# Patient Record
Sex: Female | Born: 1960 | Race: White | Hispanic: No | Marital: Married | Smoking: Never smoker
Health system: Southern US, Community
[De-identification: ages and names within clinical notes are randomized; demographics above are authoritative.]

## PROBLEM LIST (undated history)

## (undated) DIAGNOSIS — R001 Bradycardia, unspecified: Secondary | ICD-10-CM

## (undated) DIAGNOSIS — Z789 Other specified health status: Secondary | ICD-10-CM

## (undated) DIAGNOSIS — N819 Female genital prolapse, unspecified: Secondary | ICD-10-CM

## (undated) DIAGNOSIS — Z973 Presence of spectacles and contact lenses: Secondary | ICD-10-CM

## (undated) HISTORY — PX: DENTAL SURGERY: SHX609

## (undated) HISTORY — PX: TONSILLECTOMY: SUR1361

---

## 2002-10-31 ENCOUNTER — Ambulatory Visit (HOSPITAL_COMMUNITY): Admission: RE | Admit: 2002-10-31 | Discharge: 2002-10-31 | Payer: Self-pay | Admitting: Family Medicine

## 2002-10-31 ENCOUNTER — Encounter: Payer: Self-pay | Admitting: Family Medicine

## 2008-03-25 ENCOUNTER — Emergency Department (HOSPITAL_COMMUNITY): Admission: EM | Admit: 2008-03-25 | Discharge: 2008-03-25 | Payer: Self-pay | Admitting: Emergency Medicine

## 2010-06-30 ENCOUNTER — Encounter: Payer: Self-pay | Admitting: Vascular Surgery

## 2011-06-15 ENCOUNTER — Encounter: Payer: Self-pay | Admitting: Gastroenterology

## 2011-07-14 ENCOUNTER — Encounter: Payer: Self-pay | Admitting: Gastroenterology

## 2013-10-15 ENCOUNTER — Ambulatory Visit (INDEPENDENT_AMBULATORY_CARE_PROVIDER_SITE_OTHER): Payer: 59

## 2013-10-15 VITALS — BP 115/75 | HR 55 | Resp 13 | Ht 62.0 in | Wt 135.0 lb

## 2013-10-15 DIAGNOSIS — M21619 Bunion of unspecified foot: Secondary | ICD-10-CM

## 2013-10-15 DIAGNOSIS — M21611 Bunion of right foot: Secondary | ICD-10-CM

## 2013-10-15 DIAGNOSIS — M778 Other enthesopathies, not elsewhere classified: Secondary | ICD-10-CM

## 2013-10-15 DIAGNOSIS — M2010 Hallux valgus (acquired), unspecified foot: Secondary | ICD-10-CM

## 2013-10-15 DIAGNOSIS — M2011 Hallux valgus (acquired), right foot: Secondary | ICD-10-CM

## 2013-10-15 DIAGNOSIS — M779 Enthesopathy, unspecified: Secondary | ICD-10-CM

## 2013-10-15 DIAGNOSIS — M7750 Other enthesopathy of unspecified foot: Secondary | ICD-10-CM

## 2013-10-15 NOTE — Progress Notes (Signed)
   Subjective:    Patient ID: Holly Bartlett, female    DOB: [birth year], 53 y.o.   MRN: [id number]  HPI Comments: N bunion L right 1st toe and MPJ D 1 - 2 years O worsening C pain and numbness, 1st toe abducts and 1st MPJ is enlarged A weightbearing, and exercise especially biking T Dr. Lenn Sink x-rayed only     Review of Systems  All other systems reviewed and are negative.      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 No history of previous treatment and shoe changes was seen by Dr. Lenn Sink for evaluation the past. Lower extremity objective findings reveal vascular status to be intact pedal pulses palpable DP and PT +2/4 bilateral capillary refill time 3 seconds epicritic and proprioceptive sensations. He intact bilateral normal plantar response DTRs not elicited. 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 No open wounds or ulcers side erythema ulcer pain discomfort comes with enclosed shoes pressure on the area when she bicycles. Feet get her toes get him to the with the bunion area. Patient's is x-rays are reviewed at this time recommendation for likely for an [facility] bunionectomy with pin or screw fixation. Review this and literature dispensed for patient for her to consider.       Assessment & Plan:  Assessment HAV/bunion deformity right foot. Plan  at this time patient indicated for surgery at some point in the future reappoint at her convenience for surgical consult possibly consider surgery December of this year surgery be done an outpatient basis likely to day surgery as patient is a common employee. Patient is given literature about surgery will likely be read activity for type VI week period in air fracture boot although she will return to sitdown job within a week or 2 after surgery otherwise may need to be off her for 5 or 6 weeks before going back to most of her regular activities and duties. Information is dispensed the patient regarding surgery to followup in ready to consider her options. Also did make recommendation for more appropriate shoes as a Birkenstock or crocs something with a support avoid any barefoot flimsy shoes or flip-flops.  Harriet Masson DPM

## 2013-10-15 NOTE — Patient Instructions (Signed)
Bunionectomy A bunionectomy is surgery to remove a bunion. A bunion is an enlargement of the joint at the base of the big toe. It is made up of bone and soft tissue on the inside part of the joint. Over time, a painful lump appears on the inside of the joint. The big toe begins to point inward toward the second toe. New bone growth can occur and a bone spur may form. The pain eventually causes difficulty walking. A bunion usually results from inflammation caused by the irritation of poorly fitting shoes. It often begins later in life. A bunionectomy is performed when nonsurgical treatment no longer works. When surgery is needed, the extent of the procedure will depend on the degree of deformity of the foot. Your surgeon will discuss with you the different procedures and what will work best for you depending on your age and health. LET YOUR CAREGIVER KNOW ABOUT:   Previous problems with anesthetics or medicines used to numb the skin.  Allergies to dyes, iodine, foods, and/or latex.  Medicines taken including herbs, eye drops, prescription medicines (especially medicines used to "thin the blood"), aspirin and other over-the-counter medicines, and steroids (by mouth or as a cream).  History of bleeding or blood problems.  Possibility of pregnancy, if this applies.  History of blood clots in your legs and/or lungs .  Previous surgery.  Other important health problems. RISKS AND COMPLICATIONS   Infection.  Pain.  Nerve damage.  Possibility that the bunion will recur. BEFORE THE PROCEDURE  You should be present 60 minutes prior to your procedure or as directed.  PROCEDURE  Surgery is often done so that you can go home the same day (outpatient). It may be done in a hospital or in an outpatient surgical center. An anesthetic will be used to help you sleep during the procedure. Sometimes, a spinal anesthetic is used to make you numb below the waist. A cut (incision) is made over the swollen  area at the first joint of the big toe. The enlarged lump will be removed. If there is a need to reposition the bones of the big toe, this may require more than 1 incision. The bone itself may need to be cut. Screws and wires may be used in the repair. These can be removed at a later date. In severe cases, the entire joint may need to be removed and a joint replacement inserted. When done, the incision is closed with stitches (sutures). Skin adhesive strips may be added for reinforcement. They help hold the incision closed.  AFTER THE PROCEDURE  Compression bandages (dressings) are then wrapped around the wound. This helps to keep the foot in alignment and reduce swelling. Your foot will be monitored for bleeding and swelling. You will need to stay for a few hours in the recovery area before being discharged. This allows time for the anesthesia to wear off. You will be discharged home when you are awake, stable, and doing well. HOME CARE INSTRUCTIONS   You can expect to return to normal activities within 6 to 8 weeks after surgery. The foot is at increased risk for swelling for several months. When you can expect to bear weight on the operated foot will depend on the extent of your surgery. The milder the deformity, the less tissue is removed and the sooner the return to normal activity level. During the recovery period, a special shoe, boot, or cast may be worn to accommodate the surgical bandage and to help provide stability   to the foot.  Once you are home, an ice pack applied to the operative site may help with discomfort and keep swelling down. Stop using the ice if it causes discomfort.  Keep your feet raised (elevated) when possible to lessen swelling.  If you have an elastic bandage on your foot and you have numbness, tingling, or your foot becomes cold and blue, adjust the bandage to make it comfortable.  Change dressings as directed.  Keep the wound dry and clean. The wound may be washed  gently with soap and water. Gently blot dry without rubbing. Do not take baths or use swimming pools or hot tubs for 10 days, or as instructed by your caregiver.  Only take over-the-counter or prescription medicines for pain, discomfort, or fever as directed by your caregiver.  You may continue a normal diet as directed.  For activity, use crutches with no weight bearing or your orthopedic shoe as directed. Continue to use crutches or a cane as directed until you can stand without causing pain. SEEK MEDICAL CARE IF:   You have redness, swelling, bruising, or increasing pain in the wound.  There is pus coming from the wound.  You have drainage from a wound lasting longer than 1 day.  You have an oral temperature above 102 F (38.9 C).  You notice a bad smell coming from the wound or dressing.  The wound breaks open after sutures have been removed.  You develop dizzy episodes or fainting while standing.  You have persistent nausea or vomiting.  Your toes become cold.  Pain is not relieved with medicines. SEEK IMMEDIATE MEDICAL CARE IF:   You develop a rash.  You have difficulty breathing.  You develop any reaction or side effects to medicines given.  Your toes are numb or blue, or you have severe pain. MAKE SURE YOU:   Understand these instructions.  Will watch your condition.  Will get help right away if you are not doing well or get worse. Document Released: 01/28/2005 Document Revised: 05/09/2011 Document Reviewed: 03/05/2007 ExitCare Patient Information 2015 [employer]. This information is not intended to replace advice given to you by your health care provider. Make sure you discuss any questions you have with your health care provider.  

## 2013-12-01 DIAGNOSIS — N814 Uterovaginal prolapse, unspecified: Secondary | ICD-10-CM | POA: Insufficient documentation

## 2013-12-01 DIAGNOSIS — M542 Cervicalgia: Secondary | ICD-10-CM | POA: Insufficient documentation

## 2013-12-26 ENCOUNTER — Telehealth: Payer: Self-pay | Admitting: *Deleted

## 2013-12-26 NOTE — Telephone Encounter (Signed)
I called and left her another message to call and schedule an appointment with Dr. Blenda Mounts for a consultation.  Then we can schedule you for surgery when you are here.

## 2013-12-26 NOTE — Telephone Encounter (Signed)
"  I want to know why I need to schedule an appointment.  I've already seen him.  I just need to schedule surgery."  I informed her that she needs to see him for a consultation.  She stated, "What did I see him for before?  Wasn't that a consultation?"  I told her no, he took x-rays and informed you what was wrong with your foot.  For the consultation, he will go over the 3 page consent form and give you the surgical packet information.  "Well what is my time frame?"  I told her there is no time frame.  She stated, "What do you mean?  Don't I need to have it scheduled 30 days prior to surgery date or did I misunderstand him?"  I told her she can come in next week for a consultation if she likes.  Dr. Erasmo Downer surgery schedule is pretty open.  "So I can go ahead and schedule for a surgery in January?"  I told her yes.  "Okay, can I schedule an appointment with Dr. Blenda Mounts?"  I told her I would transfer her to a scheduler.

## 2013-12-26 NOTE — Telephone Encounter (Signed)
I've been waiting about 6 days for a call back.  Maybe you don't have the right number.  [phone]  I attempted to call her back.  I left her a message to call me back.

## 2014-01-07 ENCOUNTER — Ambulatory Visit (INDEPENDENT_AMBULATORY_CARE_PROVIDER_SITE_OTHER): Payer: 59

## 2014-01-07 VITALS — BP 109/67 | HR 50 | Resp 13

## 2014-01-07 DIAGNOSIS — M7751 Other enthesopathy of right foot: Secondary | ICD-10-CM

## 2014-01-07 DIAGNOSIS — M2011 Hallux valgus (acquired), right foot: Secondary | ICD-10-CM

## 2014-01-07 DIAGNOSIS — M779 Enthesopathy, unspecified: Secondary | ICD-10-CM

## 2014-01-07 DIAGNOSIS — M21611 Bunion of right foot: Secondary | ICD-10-CM

## 2014-01-07 DIAGNOSIS — M778 Other enthesopathies, not elsewhere classified: Secondary | ICD-10-CM

## 2014-01-07 NOTE — Patient Instructions (Signed)
Pre-Operative Instructions  Congratulations, you have decided to take an important step to improving your quality of life.  You can be assured that the doctors of [facility] will be with you every step of the way.  1. Plan to be at the surgery center/hospital at least 1 (one) hour prior to your scheduled time unless otherwise directed by the surgical center/hospital staff.  You must have a responsible adult accompany you, remain during the surgery and drive you home.  Make sure you have directions to the surgical center/hospital and know how to get there on time. 2. For hospital based surgery you will need to obtain a history and physical form from your family physician within 1 month prior to the date of surgery- we will give you a form for you primary physician.  3. We make every effort to accommodate the date you request for surgery.  There are however, times where surgery dates or times have to be moved.  We will contact you as soon as possible if a change in schedule is required.   4. No Aspirin/Ibuprofen for one week before surgery.  If you are on aspirin, any non-steroidal anti-inflammatory medications (Mobic, Aleve, Ibuprofen) you should stop taking it 7 days prior to your surgery.  You make take Tylenol  For pain prior to surgery.  5. Medications- If you are taking daily heart and blood pressure medications, seizure, reflux, allergy, asthma, anxiety, pain or diabetes medications, make sure the surgery center/hospital is aware before the day of surgery so they may notify you which medications to take or avoid the day of surgery. 6. No food or drink after midnight the night before surgery unless directed otherwise by surgical center/hospital staff. 7. No alcoholic beverages 24 hours prior to surgery.  No smoking 24 hours prior to or 24 hours after surgery. 8. Wear loose pants or shorts- loose enough to fit over bandages, boots, and casts. 9. No slip on shoes, sneakers are best. 10. Bring  your boot with you to the surgery center/hospital.  Also bring crutches or a walker if your physician has prescribed it for you.  If you do not have this equipment, it will be provided for you after surgery. 11. If you have not been contracted by the surgery center/hospital by the day before your surgery, call to confirm the date and time of your surgery. 12. Leave-time from work may vary depending on the type of surgery you have.  Appropriate arrangements should be made prior to surgery with your employer. 13. Prescriptions will be provided immediately following surgery by your doctor.  Have these filled as soon as possible after surgery and take the medication as directed. 14. Remove nail polish on the operative foot. 15. Wash the night before surgery.  The night before surgery wash the foot and leg well with the antibacterial soap provided and water paying special attention to beneath the toenails and in between the toes.  Rinse thoroughly with water and dry well with a towel.  Perform this wash unless told not to do so by your physician.  Enclosed: 1 Ice pack (please put in freezer the night before surgery)   1 Hibiclens skin cleaner   Pre-op Instructions  If you have any questions regarding the instructions, do not hesitate to call our office.  [place]: [address] [place], [place] [address] [phone]  [place]: [address] [place], [place] [address] [phone]  [place]: [address]  [place], [place] [address] [phone]  Dr. Honey Zakarian   Tuchman DPM, Dr. Norman Regal DPM Dr. Mercadez Heitman DPM, Dr. M. Todd Hyatt DPM, Dr. Kathryn Egerton DPM 

## 2014-01-07 NOTE — Progress Notes (Signed)
   Subjective:    Patient ID: Holly Bartlett, female    DOB: [birth year], 53 y.o.   MRN: [id number]  HPI patient presents at this time discussed surgery for painful bunion right foot. She continues to have pain tenderness and capsulitis first MTP area of prominence of the great toe joint right foot. Activities range of motion and on palpation.   Review of Systemsno new findings or systemic changes are noted    Objective:   Physical Exam  neurovascular status is intact pedal pulses are palpable epicritic and proprioceptive sensations intact and symmetric there are no contraindications to surgery at this time.again vascular status is intact with pulses palpable no open wounds or ulcers are noted x-rays head been reviewed revealing IM angle of 14 hallux abductus angle 22 sesamoid position 5 clinically there is good range of motion dorsal flexion plantar flexion hallux is slightly tracked bilaterally is dorsal medial eminence and prominence which needs to be resected. At this time did review the procedure risks, applications alternatives potential side effects patient will be under IV sedation and local anesthetic or regional block.        Assessment & Plan:  Assessment this time is hallux abductovalgus deformity capsulitis first MTP area right consent form for [facility] bunionectomy with K wire or screw fixations reviewed and signed all questions asked by the patient or answered her medications and surgery scheduled at her convenience in January 2016. She is also given paperwork for surgery done at [facility] day surgery will require H&P from her primary physician within 2 weeks of surgery time. Will likely need an H&P in December just prior to the end of the year. At this time there are no other significant contraindications and surgery scheduled her convenience with appropriate follow-up patient understands she will be in air fracture boot for proximally 1 month a boot is dispensed for her to wear and  practice in prior to surgery. She will be weightbearing in the boot however will be strict restricted from work activities for likely 6 weeks afterwards should be able to return to moderated her activities over time. Surgery scheduled at this time   Harriet Masson DPM

## 2014-02-05 ENCOUNTER — Telehealth: Payer: Self-pay | Admitting: *Deleted

## 2014-02-05 NOTE — Telephone Encounter (Signed)
Returned call-patient had questions regarding time out from work. She is having trouble getting her FMLA coverage. She is currently scheduled for January, 6 2016 and will call us to reschedule if she needs to.

## 2014-02-05 NOTE — Telephone Encounter (Signed)
Patient called with questions regarding upcoming surgery.

## 2014-02-10 ENCOUNTER — Ambulatory Visit: Payer: Self-pay | Admitting: Physician Assistant

## 2014-02-25 ENCOUNTER — Telehealth: Payer: Self-pay | Admitting: *Deleted

## 2014-02-25 NOTE — Telephone Encounter (Signed)
Pt called on cellphone while in a car, I was unable to understand the message, but was able to get the phone number.  I spoke with pt, she states she did not get the Chlorhexidine sponge for her pre-op.  I told pt I would have a surgery bag waiting at the front desk of the [facility] office for her to pick up.

## 2014-02-27 ENCOUNTER — Encounter (HOSPITAL_BASED_OUTPATIENT_CLINIC_OR_DEPARTMENT_OTHER): Payer: Self-pay | Admitting: *Deleted

## 2014-02-27 NOTE — Progress Notes (Signed)
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

## 2014-03-04 ENCOUNTER — Other Ambulatory Visit: Payer: Self-pay

## 2014-03-04 NOTE — H&P (Addendum)
Holly Bartlett is an 54 y.o. female.   Chief Complaint: Patient is a painful bunion deformity right foot x-rays confirm elevated I am angle some deviation sesamoid position. HPI: Bunion deformities and present for several years with increasing pain tenderness discomfort over the last several months pain with enclosed shoe wear and ambulation patient is tried changing shoes pads cushions with minimal or temporary success. No open wounds no ulcerations no secondary infection is noted  Past Medical History  Diagnosis Date  . Medical history non-contributory   . Wears glasses     Past Surgical History  Procedure Laterality Date  . Dental surgery      wisdom teeth    Family History  Problem Relation Age of Onset  . Diabetes Mother   . Hypertension Mother   . Cancer Mother   . Diabetes Father   . Heart disease Father    Social History:  reports that she has never smoked. She does not have any smokeless tobacco history on file. She reports that she drinks alcohol. She reports that she does not use illicit drugs.  Allergies: No Known Allergies  No prescriptions prior to admission    No results found for this or any previous visit (from the past 48 hour(s)). No results found.  Review of Systems  Constitutional: Negative.   HENT: Negative.   Eyes: Negative.   Respiratory: Negative.   Cardiovascular: Negative.   Gastrointestinal: Negative.   Musculoskeletal: Positive for joint pain.  Skin: Negative.   Neurological: Negative.   Endo/Heme/Allergies: Negative.   Psychiatric/Behavioral: Negative.     Height 5\' 2"  (1.575 m), weight 135 lb (61.236 kg). Physical Exam  Vitals reviewed. Constitutional: She is oriented to person, place, and time. She appears well-developed and well-nourished.  HENT:  Head: Normocephalic and atraumatic.  Eyes: EOM are normal. Pupils are equal, round, and reactive to light.  Neck: Normal range of motion. Neck supple.  Cardiovascular: Normal rate,  regular rhythm and intact distal pulses.   Pulses:      Dorsalis pedis pulses are 2+ on the right side, and 2+ on the left side.       Posterior tibial pulses are 2+ on the right side, and 2+ on the left side.  Lower extremity objective findings reveal pedal pulses palpable DP and PT bilateral capillary refill time 3 seconds all digits. No edema minimal or no varicosities noted skin temperature warm  Respiratory: Effort normal and breath sounds normal.  GI: She exhibits no distension. There is no tenderness.  Musculoskeletal: Normal range of motion.  Lower extremity orthopedic and biomechanical exam reveals rectus foot type bilateral notable HAV deformity right with elevated I am angle greater than 12 sesamoid position 4-5 deviation of sesamoids with increased hallux abductus angle greater than 25 IM angle greater than 12-13. X-rays are reviewed otherwise unremarkable no cysts tumors or fractures noted intact motor function is muscle strength and range of motion is of the remainder foot exam bilateral noted to be unremarkable.  Neurological: She is alert and oriented to person, place, and time. She has normal strength and normal reflexes. She displays no Babinski's sign on the right side. She displays no Babinski's sign on the left side.  Neurovascular status is intact epicritic and proprioceptive sensations intact and symmetric bilateral muscle strengths normal and intact DTRs intact and symmetric bilateral  Skin: Skin is warm and dry. No cyanosis. Nails show no clubbing.  Lower extremity objective findings skin texture turgor normal hair growth present but diminished  absent distally nail somewhat criptotic and otherwise unremarkable no open wounds no ulcers no secondary infections noted  Psychiatric: She has a normal mood and affect. Her behavior is normal. Judgment and thought content normal.     Assessment/Plan Hallux abductovalgus deformity right foot. Capsulitis first MTP area right  consistent with deformity pain discomfort with activities ambulation and include shoe wear noted.  Plan at this time is for [facility] with screw K wire fixation. There were medications to surgery at this time neurovascular status is intact pedal pulses are palpable agent is aware she'll be in a postoperative surgical shoe or boot for 5-6 week duration weightbearing with the air fracture walker. Surgery proceeded scheduled at this time.No contraindications noted,  surgery to proceed  Harriet Masson DPM  Sigfredo Schreier 03/04/2014, 5:57 PM

## 2014-03-05 ENCOUNTER — Encounter (HOSPITAL_BASED_OUTPATIENT_CLINIC_OR_DEPARTMENT_OTHER): Admission: RE | Disposition: A | Discharge status: Home / Self Care | Payer: Self-pay | Source: Ambulatory Visit

## 2014-03-05 ENCOUNTER — Ambulatory Visit (HOSPITAL_BASED_OUTPATIENT_CLINIC_OR_DEPARTMENT_OTHER)
Admission: RE | Admit: 2014-03-05 | Discharge: 2014-03-05 | Disposition: A | Discharge status: Home / Self Care | Payer: 59 | Source: Ambulatory Visit

## 2014-03-05 ENCOUNTER — Encounter (HOSPITAL_BASED_OUTPATIENT_CLINIC_OR_DEPARTMENT_OTHER): Payer: Self-pay | Admitting: Certified Registered"

## 2014-03-05 ENCOUNTER — Ambulatory Visit (HOSPITAL_BASED_OUTPATIENT_CLINIC_OR_DEPARTMENT_OTHER): Payer: 59 | Admitting: Certified Registered"

## 2014-03-05 DIAGNOSIS — M2011 Hallux valgus (acquired), right foot: Secondary | ICD-10-CM | POA: Insufficient documentation

## 2014-03-05 DIAGNOSIS — M779 Enthesopathy, unspecified: Secondary | ICD-10-CM

## 2014-03-05 HISTORY — PX: BUNIONECTOMY: SHX129

## 2014-03-05 HISTORY — DX: Other specified health status: Z78.9

## 2014-03-05 HISTORY — DX: Presence of spectacles and contact lenses: Z97.3

## 2014-03-05 LAB — POCT HEMOGLOBIN-HEMACUE: Hemoglobin: 13.2 g/dL (ref 12.0–15.0)

## 2014-03-05 SURGERY — BUNIONECTOMY
Anesthesia: Monitor Anesthesia Care | Site: Foot | Laterality: Right

## 2014-03-05 MED ORDER — ONDANSETRON HCL 4 MG/2ML IJ SOLN
INTRAMUSCULAR | Status: DC | PRN
  Administered 2014-03-05: 4 mg via INTRAVENOUS

## 2014-03-05 MED ORDER — CEPHALEXIN 500 MG PO CAPS: 500.0000 mg | ORAL_CAPSULE | Freq: Four times a day (QID) | ORAL | Status: DC

## 2014-03-05 MED ORDER — CEFAZOLIN SODIUM 1-5 GM-% IV SOLN
INTRAVENOUS | Status: AC
  Filled 2014-03-05: qty 100

## 2014-03-05 MED ORDER — LIDOCAINE HCL (CARDIAC) 20 MG/ML IV SOLN
INTRAVENOUS | Status: DC | PRN
  Administered 2014-03-05: 20 mg via INTRAVENOUS

## 2014-03-05 MED ORDER — PROPOFOL INFUSION 10 MG/ML OPTIME
INTRAVENOUS | Status: DC | PRN
  Administered 2014-03-05: 100 ug/kg/min via INTRAVENOUS

## 2014-03-05 MED ORDER — DEXAMETHASONE SODIUM PHOSPHATE 10 MG/ML IJ SOLN
INTRAMUSCULAR | Status: DC | PRN
  Administered 2014-03-05: 4 mg via INTRAVENOUS

## 2014-03-05 MED ORDER — CHLORHEXIDINE GLUCONATE 4 % EX LIQD: 60.0000 mL | Freq: Once | CUTANEOUS | Status: DC

## 2014-03-05 MED ORDER — ACETAMINOPHEN 325 MG PO TABS
ORAL_TABLET | ORAL | Status: AC
  Filled 2014-03-05: qty 3

## 2014-03-05 MED ORDER — FENTANYL CITRATE 0.05 MG/ML IJ SOLN: 25.0000 ug | INTRAMUSCULAR | Status: DC | PRN

## 2014-03-05 MED ORDER — ACETAMINOPHEN 325 MG PO TABS
ORAL_TABLET | ORAL | Status: DC | PRN
  Administered 2014-03-05: 975 mg via ORAL

## 2014-03-05 MED ORDER — BUPIVACAINE HCL (PF) 0.5 % IJ SOLN
INTRAMUSCULAR | Status: AC
  Filled 2014-03-05: qty 30

## 2014-03-05 MED ORDER — MIDAZOLAM HCL 2 MG/2ML IJ SOLN: 1.0000 mg | INTRAMUSCULAR | Status: DC | PRN

## 2014-03-05 MED ORDER — BUPIVACAINE HCL (PF) 0.5 % IJ SOLN
INTRAMUSCULAR | Status: DC | PRN
  Administered 2014-03-05: 7.5 mL via INTRA_ARTICULAR

## 2014-03-05 MED ORDER — KETOROLAC TROMETHAMINE 30 MG/ML IJ SOLN
INTRAMUSCULAR | Status: DC | PRN
  Administered 2014-03-05: 30 mg via INTRAVENOUS

## 2014-03-05 MED ORDER — PROPOFOL 10 MG/ML IV EMUL
INTRAVENOUS | Status: AC
  Filled 2014-03-05: qty 50

## 2014-03-05 MED ORDER — OXYCODONE-ACETAMINOPHEN 5-325 MG PO TABS: 1.0000 | ORAL_TABLET | ORAL | Status: DC | PRN

## 2014-03-05 MED ORDER — CEFAZOLIN SODIUM-DEXTROSE 2-3 GM-% IV SOLR
2.0000 g | INTRAVENOUS | Status: AC
  Administered 2014-03-05: 2 g via INTRAVENOUS

## 2014-03-05 MED ORDER — PROPOFOL INFUSION 10 MG/ML OPTIME: INTRAVENOUS | Status: DC | PRN

## 2014-03-05 MED ORDER — FENTANYL CITRATE 0.05 MG/ML IJ SOLN: 50.0000 ug | INTRAMUSCULAR | Status: DC | PRN

## 2014-03-05 MED ORDER — FENTANYL CITRATE 0.05 MG/ML IJ SOLN
INTRAMUSCULAR | Status: AC
  Filled 2014-03-05: qty 6

## 2014-03-05 MED ORDER — MIDAZOLAM HCL 2 MG/2ML IJ SOLN
INTRAMUSCULAR | Status: AC
  Filled 2014-03-05: qty 2

## 2014-03-05 MED ORDER — ONDANSETRON HCL 4 MG/2ML IJ SOLN: 4.0000 mg | Freq: Four times a day (QID) | INTRAMUSCULAR | Status: DC | PRN

## 2014-03-05 MED ORDER — LIDOCAINE HCL 2 % IJ SOLN
INTRAMUSCULAR | Status: AC
Start: 2014-03-05 — End: 2014-03-05
  Filled 2014-03-05: qty 20

## 2014-03-05 MED ORDER — LACTATED RINGERS IV SOLN
INTRAVENOUS | Status: DC
  Administered 2014-03-05 (×2): via INTRAVENOUS

## 2014-03-05 MED ORDER — LIDOCAINE HCL 2 % IJ SOLN
INTRAMUSCULAR | Status: DC | PRN
  Administered 2014-03-05: 7.5 mL

## 2014-03-05 MED ORDER — FENTANYL CITRATE 0.05 MG/ML IJ SOLN
INTRAMUSCULAR | Status: DC | PRN
  Administered 2014-03-05: 50 ug via INTRAVENOUS
  Administered 2014-03-05: 25 ug via INTRAVENOUS

## 2014-03-05 MED ORDER — MIDAZOLAM HCL 5 MG/5ML IJ SOLN
INTRAMUSCULAR | Status: DC | PRN
  Administered 2014-03-05 (×2): 1 mg via INTRAVENOUS

## 2014-03-05 MED ORDER — DEXAMETHASONE SODIUM PHOSPHATE 10 MG/ML IJ SOLN
INTRAMUSCULAR | Status: DC | PRN
  Administered 2014-03-05: 10 mg via INTRA_ARTICULAR

## 2014-03-05 SURGICAL SUPPLY — 55 items
BAG DECANTER FOR FLEXI CONT (MISCELLANEOUS) ×3 IMPLANT
BANDAGE ELASTIC 3 VELCRO ST LF (GAUZE/BANDAGES/DRESSINGS) ×3 IMPLANT
BENZOIN TINCTURE PRP APPL 2/3 (GAUZE/BANDAGES/DRESSINGS) IMPLANT
BLADE AVERAGE 25MMX9MM (BLADE) ×1
BLADE AVERAGE 25X9 (BLADE) ×2 IMPLANT
BLADE SURG 15 STRL LF DISP TIS (BLADE) ×2 IMPLANT
BLADE SURG 15 STRL SS (BLADE) ×4
BNDG CONFORM 2 STRL LF (GAUZE/BANDAGES/DRESSINGS) ×3 IMPLANT
BNDG ESMARK 4X9 LF (GAUZE/BANDAGES/DRESSINGS) ×3 IMPLANT
BUR SIDE CUT 44.8 STRL (BURR) IMPLANT
BURR SIDE CUT 44.8 STRL (BURR)
BURR SIDE CUT 44.8MML STRL (BURR)
CAP PIN PROTECTOR ORTHO WHT (CAP) IMPLANT
COVER BACK TABLE 60X90IN (DRAPES) ×3 IMPLANT
CUFF TOURNIQUET SINGLE 18IN (TOURNIQUET CUFF) ×3 IMPLANT
DRAPE EXTREMITY T 121X128X90 (DRAPE) ×3 IMPLANT
DRAPE OEC MINIVIEW 54X84 (DRAPES) ×3 IMPLANT
DRILL BIT WIRE PASS (BIT) IMPLANT
DRILL WIRE PASS MED (BIT) IMPLANT
DRSG EMULSION OIL 3X3 NADH (GAUZE/BANDAGES/DRESSINGS) ×3 IMPLANT
DURAPREP 26ML APPLICATOR (WOUND CARE) ×3 IMPLANT
ELECT REM PT RETURN 9FT ADLT (ELECTROSURGICAL) ×3
ELECTRODE REM PT RTRN 9FT ADLT (ELECTROSURGICAL) ×1 IMPLANT
GAUZE SPONGE 4X4 12PLY STRL (GAUZE/BANDAGES/DRESSINGS) ×3 IMPLANT
GAUZE SPONGE 4X4 16PLY XRAY LF (GAUZE/BANDAGES/DRESSINGS) IMPLANT
GLOVE BIOGEL PI IND STRL 7.0 (GLOVE) ×1 IMPLANT
GLOVE BIOGEL PI INDICATOR 7.0 (GLOVE) ×2
GLOVE ECLIPSE 6.5 STRL STRAW (GLOVE) ×3 IMPLANT
GLOVE SS BIOGEL STRL SZ 8 (GLOVE) ×1 IMPLANT
GLOVE SUPERSENSE BIOGEL SZ 8 (GLOVE) ×2
GOWN STRL REUS W/ TWL LRG LVL3 (GOWN DISPOSABLE) ×1 IMPLANT
GOWN STRL REUS W/TWL 2XL LVL3 (GOWN DISPOSABLE) ×3 IMPLANT
GOWN STRL REUS W/TWL LRG LVL3 (GOWN DISPOSABLE) ×2
K-WIRE .045X4 (WIRE) ×3 IMPLANT
NDL SAFETY ECLIPSE 18X1.5 (NEEDLE) ×2 IMPLANT
NEEDLE HYPO 18GX1.5 SHARP (NEEDLE) ×4
NEEDLE HYPO 25X1 1.5 SAFETY (NEEDLE) ×6 IMPLANT
NS IRRIG 1000ML POUR BTL (IV SOLUTION) IMPLANT
PACK BASIN DAY SURGERY FS (CUSTOM PROCEDURE TRAY) ×3 IMPLANT
PADDING CAST ABS 4INX4YD NS (CAST SUPPLIES) ×2
PADDING CAST ABS COTTON 4X4 ST (CAST SUPPLIES) ×1 IMPLANT
PENCIL BUTTON HOLSTER BLD 10FT (ELECTRODE) ×3 IMPLANT
SHEET MEDIUM DRAPE 40X70 STRL (DRAPES) ×3 IMPLANT
STOCKINETTE 6  STRL (DRAPES) ×2
STOCKINETTE 6 STRL (DRAPES) ×1 IMPLANT
STRIP SUTURE WOUND CLOSURE 1/2 (SUTURE) IMPLANT
SUT VIC AB 3-0 PS1 18 (SUTURE)
SUT VIC AB 3-0 PS1 18XBRD (SUTURE) IMPLANT
SUT VIC AB 5-0 PS2 18 (SUTURE) ×3 IMPLANT
SUT VICRYL 4-0 PS2 18IN ABS (SUTURE) ×3 IMPLANT
SYR 3ML 18GX1 1/2 (SYRINGE) ×3 IMPLANT
SYR 5ML LL (SYRINGE) ×3 IMPLANT
SYR BULB 3OZ (MISCELLANEOUS) ×3 IMPLANT
SYRINGE 10CC LL (SYRINGE) ×3 IMPLANT
UNDERPAD 30X30 INCONTINENT (UNDERPADS AND DIAPERS) ×3 IMPLANT

## 2014-03-05 NOTE — Discharge Instructions (Addendum)
After Surgery Instructions   1) If you are recuperating from surgery anywhere other than home, please be sure to leave [place] the number where you can be reached.  2) Go directly home and rest.  3) Keep the operated foot(feet) elevated six inches above the hip when sitting or lying down. This will help control swelling and pain.  4) Support the elevated foot and leg with pillows. DO NOT PLACE PILLOWS UNDER THE KNEE.  5) DO NOT REMOVE or get your bandages WET, unless you were given different instructions by your doctor to do so. This increases the risk of infection.  6) Wear your surgical shoe or surgical boot at all times when you are up on your feet.  7) A limited amount of pain and swelling may occur. The skin may take on a bruised appearance. DO NOT BE ALARMED, THIS IS NORMAL.  8) For slight pain and swelling, apply an ice pack directly over the bandages for 15 minutes only out of each hour of the day. Continue until seen in the office for your first post op visit. DON NOT     APPLY ANY FORM OF HEAT TO THE AREA.  9) Have prescriptions filled immediately and take as directed.  10) Drink lots of liquids, water and juice to stay hydrated.  11) CALL IMMEDIATELY IF:  *Bleeding continues until the following day of surgery  *Pain increases and/or does not respond to medication  *Bandages or cast appears to tight  *If your bandage gets wet  *Trip, fall or stump your surgical foot  *If your temperature goes above 101  *If you have ANY questions at all  [place]. ADHERING TO THESE INSTRUCTIONS WILL OFFER YOU THE MOST COMPLETE RESULTS    Post Anesthesia Home Care Instructions  Activity: Get plenty of rest for the remainder of the day. A responsible adult should stay with you for 24 hours following the procedure.  For the next 24 hours, DO NOT: -Drive a car -Paediatric nurse -Drink alcoholic beverages -Take any medication unless instructed by your  physician -Make any legal decisions or sign important papers.  Meals: Start with liquid foods such as gelatin or soup. Progress to regular foods as tolerated. Avoid greasy, spicy, heavy foods. If nausea and/or vomiting occur, drink only clear liquids until the nausea and/or vomiting subsides. Call your physician if vomiting continues.  Special Instructions/Symptoms: Your throat may feel dry or sore from the anesthesia or the breathing tube placed in your throat during surgery. If this causes discomfort, gargle with warm salt water. The discomfort should disappear within 24 hours.

## 2014-03-05 NOTE — Op Note (Signed)
Procedure(s): AUSTIN BUNIONECTOMY RIGHT FOOT Procedure Note  Holly Bartlett female 54 y.o. 03/05/2014  Procedure(s) and Anesthesia Type:    * AUSTIN BUNIONECTOMY RIGHT FOOT - Monitor Anesthesia Care  Surgeon(s) and Role:    * Harriet Masson, DPM - Primary   Indications: patient has a several month history of increasing pain tenderness discomfort affecting her right great toe joint bunion deformity based on clinical and radiographic findings patient is requesting surgery to be completed at this time for correction of bunion with internal fixation.        Surgeon: Harriet Masson   Assistants: none  Anesthesia: Monitored Local Anesthesia with Sedation  ASA Class: 1    Procedure Detail  AUSTIN BUNIONECTOMY RIGHT FOOT  Findings:  Patient was brought to your placed on the table in the supine position. IV sedation was established, local anesthetic block was then administered with a total of 15 mL 50-50 mixture of 2% Xylocaine plain and 0.5% Marcaine plain in a Mayo block fashion. The right foot was exsanguinated via Esmarch wrap and ankle tourniquet inflated to 250 mmHg. Following procedure was then carried out.  Attention was directed to the dorsal aspect of the right foot over the first MTP joint proximally 7 cm curvilinear incision was made just medial to and paralleling the long extensor tendon. The incision was deepened via sharp dissection to the capsular structures. An inverted L capsular incision was made medial collateral ligaments were freed and the head of the first metatarsal was delivered into the operative field. Utilizing partial rotation hypertrophied dorsal and medial eminence were resected flush with the shaft. Attention redirected to the interspace where the conjoined tendon of the adductor hallucis was identified and a 1 cm section removed. The fibular sesamoid was then freed distally proximally and laterally. Upon returning to the first metatarsal head a through and  through V osteotomy was carried out in standard [place] fashion. The capital fragment was shifted laterally and impacted and fixated with a single K wire 0.045 from dorsal proximal to plantar distal and orientation. The wire was cut and bent and left lying flush to the metatarsal shaft redundant medial shelf was resected and all rough edges were smoothed. The site was flushed with copious muscle Starbuck solution and cleared of all soft tissue and osseous debris. Fluoroscopy confirmed fixation placement and osteotomy placement and good clinical and radiographic alignments.  Medial capsulorrhaphy was performed and capsule reapproximated with 3-0 Vicryl. Subcutaneous tissue reapproximated with 4-0 Vicryl, and skin closed with 5-0 Vicryl in a subcuticular fashion. The site was infiltrated with 1 mL dexamethasone phosphate 4 mg/mL. Betadine Adaptic and a dry sterile compressive dressing were applied to the right foot Steri-Strips having been placed. Once dressed the ankle tourniquet was deflated with immediate return of perfusion to all digits being noted.  Patient was returned from the OR to recovery in satisfactory condition. Discharged with oral and written postop instructions. An appointment for follow-up office visit at the [facility].    Estimated Blood Loss:  Minimal         Drains:none             Specimens: none         I mplants:0.045 K wire was used for fixation        Complications:  * No complications entered in OR log *         Disposition: PACU - hemodynamically stable.         Condition: stable  Harriet Masson DPM

## 2014-03-05 NOTE — Anesthesia Preprocedure Evaluation (Signed)
Anesthesia Evaluation  Patient identified by MRN, date of birth, ID band Patient awake    Reviewed: Allergy & Precautions, NPO status , Patient's Chart, lab work & pertinent test results  Airway Mallampati: II   Neck ROM: full    Dental   Pulmonary neg pulmonary ROS,          Cardiovascular negative cardio ROS      Neuro/Psych    GI/Hepatic   Endo/Other    Renal/GU      Musculoskeletal   Abdominal   Peds  Hematology   Anesthesia Other Findings   Reproductive/Obstetrics                             Anesthesia Physical Anesthesia Plan  ASA: I  Anesthesia Plan: MAC   Post-op Pain Management:    Induction: Intravenous  Airway Management Planned: Simple Face Mask  Additional Equipment:   Intra-op Plan:   Post-operative Plan:   Informed Consent: I have reviewed the patients History and Physical, chart, labs and discussed the procedure including the risks, benefits and alternatives for the proposed anesthesia with the patient or authorized representative who has indicated his/her understanding and acceptance.     Plan Discussed with: CRNA, Anesthesiologist and Surgeon  Anesthesia Plan Comments:         Anesthesia Quick Evaluation

## 2014-03-05 NOTE — Anesthesia Postprocedure Evaluation (Signed)
Anesthesia Post Note  Patient: Holly Bartlett  Procedure(s) Performed: Procedure(s) (LRB): AUSTIN BUNIONECTOMY RIGHT FOOT (Right)  Anesthesia type: MAC  Patient location: PACU  Post pain: Pain level controlled and Adequate analgesia  Post assessment: Post-op Vital signs reviewed, Patient's Cardiovascular Status Stable and Respiratory Function Stable  Last Vitals:  Filed Vitals:   03/05/14 1000  BP: 116/88  Pulse: 51  Temp:   Resp: 17    Post vital signs: Reviewed and stable  Level of consciousness: awake, alert  and oriented  Complications: No apparent anesthesia complications

## 2014-03-05 NOTE — Brief Op Note (Signed)
Subjective: Day of Surgery Procedure(s) (LRB): AUSTIN BUNIONECTOMY RIGHT FOOT (Right) Patient reports pain as 0 on 0-10 scale.    Objective: Vital signs in last 24 hours: Temp:  [97.7 F (36.5 C)] 97.7 F (36.5 C) (01/06 0714) Pulse Rate:  [57-61] 57 (01/06 0939) Resp:  [12-16] 12 (01/06 0939) BP: (115)/(75) 115/75 mmHg (01/06 0714) SpO2:  [99 %] 99 % (01/06 0939) Weight:  [135 lb (61.236 kg)] 135 lb (61.236 kg) (01/06 0714)  Intake/Output from previous day:   Intake/Output this shift: Total I/O In: 1100 [I.V.:1100] Out: -    Recent Labs  03/05/14 0807  HGB 13.2   No results for input(s): WBC, RBC, HCT, PLT in the last 72 hours. No results for input(s): NA, K, CL, CO2, BUN, CREATININE, GLUCOSE, CALCIUM in the last 72 hours. No results for input(s): LABPT, INR in the last 72 hours.  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 Anesthesia IV sedation and local anesthetic block total of 15 mL 50-50 mixture of 2% Xylocaine plain and 0.5% Marcaine plain were infiltrated in a Mayo block fashion.  Hemostasis: Ankle tourniquet at 250 mmHg 31 minutes.  Estimated blood loss minimal less than 1 mL  Meds and injectables: Upon completion of surgery the site was infiltrated with 1 mL dexamethasone phosphate 10 oh grams per cc  Condition patient tolerated surgery well vital signs stable with return to the OR from the OR to recovery in satisfactory condition. To be discharged from recovery per anesthesia with postoperative home  instructions prescriptions for pain antibiotic medications.  Novalyn Lajara 03/05/2014, 9:42 AM

## 2014-03-05 NOTE — Transfer of Care (Signed)
Immediate Anesthesia Transfer of Care Note  Patient: Holly Bartlett  Procedure(s) Performed: Procedure(s): AUSTIN BUNIONECTOMY RIGHT FOOT (Right)  Patient Location: PACU  Anesthesia Type:MAC  Level of Consciousness: awake, alert , oriented and patient cooperative  Airway & Oxygen Therapy: Patient Spontanous Breathing and Patient connected to face mask oxygen  Post-op Assessment: Report given to PACU RN, Post -op Vital signs reviewed and stable and Patient moving all extremities  Post vital signs: Reviewed and stable  Complications: No apparent anesthesia complications

## 2014-03-05 NOTE — Anesthesia Procedure Notes (Signed)
Procedure Name: MAC Date/Time: 03/05/2014 8:46 AM Performed by: Baxter Flattery Pre-anesthesia Checklist: Patient identified, Emergency Drugs available, Suction available and Patient being monitored Patient Re-evaluated:Patient Re-evaluated prior to inductionOxygen Delivery Method: Simple face mask Preoxygenation: Pre-oxygenation with 100% oxygen Ventilation: Mask ventilation without difficulty Dental Injury: Teeth and Oropharynx as per pre-operative assessment

## 2014-03-06 ENCOUNTER — Encounter (HOSPITAL_BASED_OUTPATIENT_CLINIC_OR_DEPARTMENT_OTHER): Payer: Self-pay

## 2014-03-11 ENCOUNTER — Ambulatory Visit (INDEPENDENT_AMBULATORY_CARE_PROVIDER_SITE_OTHER): Payer: 59

## 2014-03-11 VITALS — BP 105/68 | HR 55 | Resp 18

## 2014-03-11 DIAGNOSIS — M2011 Hallux valgus (acquired), right foot: Secondary | ICD-10-CM

## 2014-03-11 DIAGNOSIS — Z09 Encounter for follow-up examination after completed treatment for conditions other than malignant neoplasm: Secondary | ICD-10-CM

## 2014-03-11 NOTE — Patient Instructions (Addendum)
ICE INSTRUCTIONS  Apply ice or cold pack to the affected area at least 3 times a day for 10-15 minutes each time.  You should also use ice after prolonged activity or vigorous exercise.  Do not apply ice longer than 20 minutes at one time.  Always keep a cloth between your skin and the ice pack to prevent burns.  Being consistent and following these instructions will help control your symptoms.  We suggest you purchase a gel ice pack because they are reusable and do bit leak.  Some of them are designed to wrap around the area.  Use the method that works best for you.  Here are some other suggestions for icing.   Use a frozen bag of peas or corn-inexpensive and molds well to your body, usually stays frozen for 10 to 20 minutes.  Wet a towel with cold water and squeeze out the excess until it's damp.  Place in a bag in the freezer for 20 minutes. Then remove and use.   Maintain boot for 4 more weeks as instructed. Must wear boot for any walking standing or during sleep.  Starting next week may removed bandages and dressings and resume normal bathing and washing the foot. After washing the apply Neosporin are cocoa butter to the incision areas to help with scar. After removing bandages next week we'll not need to rewrap the foot however will maintain the [employer] or compression stocking during the day. Stocking may be removed every evening washed in air dry over night and reapplied in the mornings.  Activity increases as follows allowed 5 minutes per hour of walking the first week second week 10 minutes per hour, third week 15 minutes per hour and so on. Next  Reappointed 4 weeks for follow-up x-ray. Maintain boot at all times for any walking or standing

## 2014-03-11 NOTE — Progress Notes (Signed)
   Subjective:    Patient ID: Holly Bartlett, female    DOB: 1960/05/16, 54 y.o.   MRN: [id number]  HPI i had surgery on my right foot and it is doing good and i have not taken any pain medicine since friday    Review of Systems no new findings or systemic changes noted     Objective:   Physical Exam Neurovascular status is intact pedal pulses palpable mild edema and ecchymosis consistent with postop course. X-rays reveal good position the osteotomy and hallux and K wire fixation clinically good range of motion approximately 50 dorsal flexion 510 plantar flexion no crepitus is noted dry sterile dressing reapplied at this time maintain dressing for one week. After week may remove dressings and resume bathing and hygiene steadily increasing walking activities as instructed maintain air fracture boot for walking at all times. Advised that active and passive range of motion exercises to great toe every day 100 toe pushup today recommended.       Assessment & Plan:  Assessment good postop progress presto dressing is reapplied at this time maintained for one more week reappointed 4 weeks for long-term postop follow-up and x-rays after that time may likely return to walking tennis or athletic shoes. At 6 weeks should be returned to work activities. The increasing walking activities each week by 5 minutes. Reappointed 4 weeks  Harriet Masson DPM

## 2014-03-18 ENCOUNTER — Telehealth: Payer: Self-pay | Admitting: *Deleted

## 2014-03-18 NOTE — Progress Notes (Signed)
DOS 03/05/2014 right austin bunionectomy

## 2014-03-18 NOTE — Telephone Encounter (Addendum)
Pt states there is one white stitch left, can she remove it and can she go swimming in chlorinated pool.  Dr. Blenda Mounts states it is okay to swim in the chlorinated pool, and to leave the white stitch alone it will fall off by itself.  Left message.

## 2014-03-18 NOTE — Telephone Encounter (Signed)
Please advice  

## 2014-03-19 ENCOUNTER — Telehealth: Payer: Self-pay | Admitting: *Deleted

## 2014-03-19 NOTE — Telephone Encounter (Signed)
Pt states she still has a stitch in the surgery foot.  I called pt, asked if she listened to her voicemail yesterday, because a message was left, and I didn't want her to think no returned her call.  I told her that Dr. Blenda Mounts said it would be okay to swim in a chlorinated pool, and to leave the suture alone it would fall off.  Pt stated her phone sometimes drops messages and she will leave the suture alone.

## 2014-03-28 ENCOUNTER — Telehealth: Payer: Self-pay | Admitting: *Deleted

## 2014-03-28 NOTE — Telephone Encounter (Signed)
Pt request return to work note.

## 2014-03-31 NOTE — Telephone Encounter (Signed)
According to my last notes patient was scheduled to return to work at 6 weeks postop not at this time she would be ripped allowed to return to work after her next visit and next follow-up x-ray. I'm not aware of her returning to work at this time. Need to verify that she is not to be standing or walking if she has return to work, it would be a sitting only job.. She is to still be in her air fracture boot at all times her next follow-up visit  Holly Bartlett DPM

## 2014-04-03 ENCOUNTER — Telehealth: Payer: Self-pay

## 2014-04-03 NOTE — Telephone Encounter (Signed)
Spoke with patient regarding post operative status and returning to  Work, she agreed to stay out and discuss returning with the doctor at her next visit. Encouraged her to continue wearing the cam walker and continue with her toe exercises. Call if complications occur

## 2014-04-08 ENCOUNTER — Ambulatory Visit (INDEPENDENT_AMBULATORY_CARE_PROVIDER_SITE_OTHER): Payer: 59

## 2014-04-08 VITALS — BP 128/86 | HR 80 | Resp 12

## 2014-04-08 DIAGNOSIS — M21611 Bunion of right foot: Secondary | ICD-10-CM

## 2014-04-08 DIAGNOSIS — M2011 Hallux valgus (acquired), right foot: Secondary | ICD-10-CM

## 2014-04-08 DIAGNOSIS — Z09 Encounter for follow-up examination after completed treatment for conditions other than malignant neoplasm: Secondary | ICD-10-CM

## 2014-04-08 NOTE — Patient Instructions (Signed)

## 2014-04-08 NOTE — Progress Notes (Signed)
   Subjective:    Patient ID: Holly Bartlett, female    DOB: [birth year], 54 y.o.   MRN: [id number]  HPI  DOS 03/05/2014 right austin bunionectomy.  ''RT FOOT IS DOING MUCH BETTER.''  Review of Systems no new findings or systemic changes noted    Objective:   Physical Exam Patient presents for approxi-five-week status post Austin bunionectomy right foot doing well excellent range of motion at least 60 dorsiflexion 5-10 plantar flexion. No crepitus minimal edema still present assessment good postop progress       Assessment & Plan:  Good postop progress noted reappointed in 2 months for long-term postop follow-up continue with active and passive range of motion exercises. 2 months can begin exercise activities however at this time may start doing just mild walking mild apply these are yoga type exercises avoid impact type activities until between 2 and 3 months postop. Again reappointed 2 months for long-term postop follow-up and x-ray  Harriet Masson DPM

## 2014-04-09 ENCOUNTER — Telehealth: Payer: Self-pay | Admitting: *Deleted

## 2014-04-09 NOTE — Telephone Encounter (Signed)
Pt states [facility] needs a return to work note, faxed to [phone].

## 2014-04-10 NOTE — Telephone Encounter (Signed)
Amy,can you call this patient, and tell her the protocol for return to work notes

## 2014-06-03 ENCOUNTER — Ambulatory Visit: Payer: 59

## 2014-06-05 ENCOUNTER — Ambulatory Visit: Payer: 59 | Admitting: Podiatry

## 2015-04-23 DIAGNOSIS — Z139 Encounter for screening, unspecified: Secondary | ICD-10-CM | POA: Diagnosis not present

## 2015-04-23 DIAGNOSIS — Z1231 Encounter for screening mammogram for malignant neoplasm of breast: Secondary | ICD-10-CM | POA: Diagnosis not present

## 2015-09-22 DIAGNOSIS — R319 Hematuria, unspecified: Secondary | ICD-10-CM | POA: Insufficient documentation

## 2015-11-03 DIAGNOSIS — Z6824 Body mass index (BMI) 24.0-24.9, adult: Secondary | ICD-10-CM | POA: Diagnosis not present

## 2015-11-03 DIAGNOSIS — Z01419 Encounter for gynecological examination (general) (routine) without abnormal findings: Secondary | ICD-10-CM | POA: Diagnosis not present

## 2015-11-03 DIAGNOSIS — Z0000 Encounter for general adult medical examination without abnormal findings: Secondary | ICD-10-CM | POA: Diagnosis not present

## 2015-11-05 DIAGNOSIS — R319 Hematuria, unspecified: Secondary | ICD-10-CM | POA: Diagnosis not present

## 2015-11-12 DIAGNOSIS — Z13820 Encounter for screening for osteoporosis: Secondary | ICD-10-CM | POA: Diagnosis not present

## 2016-06-28 MED FILL — AMOXICILLIN 500 MG CAPSULE: 500 | 7 days supply | Qty: 30 | Fill #0

## 2016-06-30 DIAGNOSIS — Z1231 Encounter for screening mammogram for malignant neoplasm of breast: Secondary | ICD-10-CM | POA: Diagnosis not present

## 2016-08-03 DIAGNOSIS — R05 Cough: Secondary | ICD-10-CM | POA: Diagnosis not present

## 2016-08-03 DIAGNOSIS — J4521 Mild intermittent asthma with (acute) exacerbation: Secondary | ICD-10-CM | POA: Diagnosis not present

## 2016-08-03 MED FILL — VENTOLIN HFA 90 MCG INHALER: 108 (90 BAS | 16 days supply | Qty: 18 | Fill #0

## 2016-08-03 MED FILL — BENZONATATE 100 MG CAP: 100 | 20 days supply | Qty: 60 | Fill #0

## 2016-08-12 DIAGNOSIS — J40 Bronchitis, not specified as acute or chronic: Secondary | ICD-10-CM | POA: Diagnosis not present

## 2016-08-12 MED FILL — HYDROCODONE-HOMATROPINE SYR: 5-1.5 | 10 days supply | Qty: 120 | Fill #0

## 2016-08-12 MED FILL — AZITHROMYCIN 250 MG TAB: 250 | 6 days supply | Qty: 6 | Fill #0

## 2016-12-12 DIAGNOSIS — H16223 Keratoconjunctivitis sicca, not specified as Sjogren's, bilateral: Secondary | ICD-10-CM | POA: Diagnosis not present

## 2016-12-12 DIAGNOSIS — Z1321 Encounter for screening for nutritional disorder: Secondary | ICD-10-CM | POA: Diagnosis not present

## 2016-12-12 DIAGNOSIS — Z803 Family history of malignant neoplasm of breast: Secondary | ICD-10-CM | POA: Diagnosis not present

## 2016-12-12 DIAGNOSIS — Z13220 Encounter for screening for lipoid disorders: Secondary | ICD-10-CM | POA: Diagnosis not present

## 2016-12-12 DIAGNOSIS — Z6825 Body mass index (BMI) 25.0-25.9, adult: Secondary | ICD-10-CM | POA: Diagnosis not present

## 2016-12-12 DIAGNOSIS — Z1329 Encounter for screening for other suspected endocrine disorder: Secondary | ICD-10-CM | POA: Diagnosis not present

## 2016-12-12 DIAGNOSIS — Z01419 Encounter for gynecological examination (general) (routine) without abnormal findings: Secondary | ICD-10-CM | POA: Diagnosis not present

## 2016-12-12 DIAGNOSIS — H524 Presbyopia: Secondary | ICD-10-CM | POA: Diagnosis not present

## 2016-12-12 DIAGNOSIS — Z131 Encounter for screening for diabetes mellitus: Secondary | ICD-10-CM | POA: Diagnosis not present

## 2016-12-12 DIAGNOSIS — H5213 Myopia, bilateral: Secondary | ICD-10-CM | POA: Diagnosis not present

## 2016-12-15 DIAGNOSIS — S0101XA Laceration without foreign body of scalp, initial encounter: Secondary | ICD-10-CM | POA: Diagnosis not present

## 2016-12-15 DIAGNOSIS — S1093XA Contusion of unspecified part of neck, initial encounter: Secondary | ICD-10-CM | POA: Diagnosis not present

## 2016-12-15 DIAGNOSIS — Z23 Encounter for immunization: Secondary | ICD-10-CM | POA: Diagnosis not present

## 2016-12-15 DIAGNOSIS — S0990XA Unspecified injury of head, initial encounter: Secondary | ICD-10-CM | POA: Diagnosis not present

## 2016-12-16 DIAGNOSIS — S1093XA Contusion of unspecified part of neck, initial encounter: Secondary | ICD-10-CM | POA: Diagnosis not present

## 2016-12-16 DIAGNOSIS — Z23 Encounter for immunization: Secondary | ICD-10-CM | POA: Diagnosis not present

## 2016-12-16 DIAGNOSIS — S0990XA Unspecified injury of head, initial encounter: Secondary | ICD-10-CM | POA: Diagnosis not present

## 2016-12-16 DIAGNOSIS — S0101XA Laceration without foreign body of scalp, initial encounter: Secondary | ICD-10-CM | POA: Diagnosis not present

## 2017-03-08 MED FILL — SHINGRIX 50 MCG SUS: 50 | 1 days supply | Qty: 1 | Fill #0

## 2017-04-25 DIAGNOSIS — N8189 Other female genital prolapse: Secondary | ICD-10-CM | POA: Diagnosis not present

## 2017-05-17 MED FILL — SHINGRIX 50 MCG SUS: 50 | 1 days supply | Qty: 1 | Fill #1

## 2017-05-23 DIAGNOSIS — N8189 Other female genital prolapse: Secondary | ICD-10-CM | POA: Diagnosis not present

## 2017-10-26 DIAGNOSIS — Z832 Family history of diseases of the blood and blood-forming organs and certain disorders involving the immune mechanism: Secondary | ICD-10-CM | POA: Diagnosis not present

## 2017-12-19 ENCOUNTER — Encounter (HOSPITAL_BASED_OUTPATIENT_CLINIC_OR_DEPARTMENT_OTHER): Payer: Self-pay | Admitting: *Deleted

## 2017-12-27 NOTE — H&P (Addendum)
Holly Bartlett is an 57 y.o. female with pelvic organ prolapse presents for surgical mngt  Menstrual History: No LMP recorded. Patient is postmenopausal.    Past Medical History:  Diagnosis Date  . Medical history non-contributory   . Wears glasses     Past Surgical History:  Procedure Laterality Date  . BUNIONECTOMY Right 03/05/2014   Procedure: AUSTIN BUNIONECTOMY RIGHT FOOT;  Surgeon: Harriet Masson, DPM;  Location: [facility];  Service: Podiatry;  Laterality: Right;  . DENTAL SURGERY     wisdom teeth    Family History  Problem Relation Age of Onset  . Diabetes Mother   . Hypertension Mother   . Cancer Mother   . Diabetes Father   . Heart disease Father     Social History:  reports that she has never smoked. She does not have any smokeless tobacco history on file. She reports that she drinks alcohol. She reports that she does not use drugs.  Allergies: No Known Allergies  No medications prior to admission.    ROS  AF, VSS Physical Exam  Gen - NAD Abd - soft, NT/ND CV - RRR Lungs - clear PV - grade 3 anterior vaginal wall prolapse, grade 1 uterine and posterior vaginal wall prolapse  PV [place]:  Uterus and adnexa appear normal.  No free fluid  Assessment/Plan: Pelvic organ prolapse LAVH/BSO, A&P rpr with possible SSLS R/b/a discussed, questions answered, informed consent Holly Bartlett 12/27/2017, 9:43 AM

## 2018-01-09 DIAGNOSIS — Z01419 Encounter for gynecological examination (general) (routine) without abnormal findings: Secondary | ICD-10-CM | POA: Diagnosis not present

## 2018-01-09 DIAGNOSIS — N8182 Incompetence or weakening of pubocervical tissue: Secondary | ICD-10-CM | POA: Diagnosis not present

## 2018-01-09 DIAGNOSIS — Z6825 Body mass index (BMI) 25.0-25.9, adult: Secondary | ICD-10-CM | POA: Diagnosis not present

## 2018-01-09 DIAGNOSIS — Z1231 Encounter for screening mammogram for malignant neoplasm of breast: Secondary | ICD-10-CM | POA: Diagnosis not present

## 2018-01-10 NOTE — Patient Instructions (Addendum)
Jozlyn A Staat  01/10/2018      Your procedure is scheduled on 01-15-18   Report to [facility]  at  5:30A.M   Call this number if you have problems the morning of surgery:[phone]    OUR ADDRESS IS [address], WE ARE LOCATED IN THE [facility] WITH [facility].   Remember:  Do not eat food or drink liquids after midnight.  Take these medicines the morning of surgery with A SIP OF WATER:  tylenol if needed   Do not wear jewelry, make-up or nail polish.  Do not wear lotions, powders, or perfumes, or deoderant.  Do not shave 48 hours prior to surgery.  Men may shave face and neck.  Do not bring valuables to the hospital.  [facility] is not responsible for any belongings or valuables.  Contacts, dentures or bridgework may not be worn into surgery.  Leave your suitcase in the car.  After surgery it may be brought to your room.  For patients admitted to the hospital, discharge time will be determined by your treatment team.  Patients discharged the day of surgery will not be allowed to drive home.   Special instructions: n/a  Please read over the following fact sheets that you were given:       [facility] - Preparing for Surgery Before surgery, you can play an important role.  Because skin is not sterile, your skin needs to be as free of germs as possible.  You can reduce the number of germs on your skin by washing with CHG (chlorahexidine gluconate) soap before surgery.  CHG is an antiseptic cleaner which kills germs and bonds with the skin to continue killing germs even after washing. Please DO NOT use if you have an allergy to CHG or antibacterial soaps.  If your skin becomes reddened/irritated stop using the CHG and inform your nurse when you arrive at Short Stay. Do not shave (including legs and underarms) for at least 48 hours prior to the first CHG shower.  You may shave your face/neck. Please follow these instructions  carefully:  1.  Shower with CHG Soap the night before surgery and the  morning of Surgery.  2.  If you choose to wash your hair, wash your hair first as usual with your  normal  shampoo.  3.  After you shampoo, rinse your hair and body thoroughly to remove the  shampoo.                           4.  Use CHG as you would any other liquid soap.  You can apply chg directly  to the skin and wash                       Gently with a scrungie or clean washcloth.  5.  Apply the CHG Soap to your body ONLY FROM THE NECK DOWN.   Do not use on face/ open                           Wound or open sores. Avoid contact with eyes, ears mouth and genitals (private parts).                       Wash face,  Genitals (private parts) with your normal soap.  6.  Wash thoroughly, paying special attention to the area where your surgery  will be performed.  7.  Thoroughly rinse your body with warm water from the neck down.  8.  DO NOT shower/wash with your normal soap after using and rinsing off  the CHG Soap.                9.  Pat yourself dry with a clean towel.            10.  Wear clean pajamas.            11.  Place clean sheets on your bed the night of your first shower and do not  sleep with pets. Day of Surgery : Do not apply any lotions/deodorants the morning of surgery.  Please wear clean clothes to the hospital/surgery center.  FAILURE TO FOLLOW THESE INSTRUCTIONS MAY RESULT IN THE CANCELLATION OF YOUR SURGERY PATIENT SIGNATURE_________________________________  NURSE SIGNATURE__________________________________  ________________________________________________________________________

## 2018-01-11 ENCOUNTER — Other Ambulatory Visit: Payer: Self-pay | Admitting: Obstetrics and Gynecology

## 2018-01-11 ENCOUNTER — Encounter (HOSPITAL_COMMUNITY): Payer: Self-pay

## 2018-01-11 ENCOUNTER — Other Ambulatory Visit: Payer: Self-pay

## 2018-01-11 ENCOUNTER — Encounter (HOSPITAL_COMMUNITY)
Admission: RE | Admit: 2018-01-11 | Discharge: 2018-01-11 | Disposition: A | Discharge status: Home / Self Care | Payer: 59 | Source: Ambulatory Visit | Attending: Obstetrics and Gynecology | Admitting: Obstetrics and Gynecology

## 2018-01-11 DIAGNOSIS — R928 Other abnormal and inconclusive findings on diagnostic imaging of breast: Secondary | ICD-10-CM

## 2018-01-11 DIAGNOSIS — Z01812 Encounter for preprocedural laboratory examination: Secondary | ICD-10-CM | POA: Diagnosis not present

## 2018-01-11 DIAGNOSIS — N819 Female genital prolapse, unspecified: Secondary | ICD-10-CM | POA: Diagnosis not present

## 2018-01-11 HISTORY — DX: Bradycardia, unspecified: R00.1

## 2018-01-11 HISTORY — DX: Female genital prolapse, unspecified: N81.9

## 2018-01-11 LAB — CBC
HCT: 40.1 % (ref 36.0–46.0)
Hemoglobin: 13 g/dL (ref 12.0–15.0)
MCH: 30.2 pg (ref 26.0–34.0)
MCHC: 32.4 g/dL (ref 30.0–36.0)
MCV: 93.3 fL (ref 80.0–100.0)
PLATELETS: 308 10*3/uL (ref 150–400)
RBC: 4.3 MIL/uL (ref 3.87–5.11)
RDW: 12.5 % (ref 11.5–15.5)
WBC: 4.5 10*3/uL (ref 4.0–10.5)
nRBC: 0 % (ref 0.0–0.2)

## 2018-01-11 LAB — ABO/RH: ABO/RH(D): A POS

## 2018-01-14 NOTE — Anesthesia Preprocedure Evaluation (Addendum)
Anesthesia Evaluation  Patient identified by MRN, date of birth, ID band Patient awake    Reviewed: Allergy & Precautions, NPO status , Patient's Chart, lab work & pertinent test results  Airway Mallampati: III  TM Distance: >3 FB Neck ROM: Full    Dental no notable dental hx.    Pulmonary neg pulmonary ROS,    Pulmonary exam normal breath sounds clear to auscultation       Cardiovascular negative cardio ROS Normal cardiovascular exam Rhythm:Regular Rate:Normal     Neuro/Psych negative neurological ROS  negative psych ROS   GI/Hepatic negative GI ROS, Neg liver ROS,   Endo/Other  negative endocrine ROS  Renal/GU negative Renal ROS     Musculoskeletal negative musculoskeletal ROS (+)   Abdominal   Peds  Hematology negative hematology ROS (+)   Anesthesia Other Findings Pelvic organ prolapse  Reproductive/Obstetrics                            Anesthesia Physical Anesthesia Plan  ASA: II  Anesthesia Plan: General   Post-op Pain Management:    Induction: Intravenous  PONV Risk Score and Plan: 4 or greater and Scopolamine patch - Pre-op, Midazolam, Dexamethasone, Ondansetron and Treatment may vary due to age or medical condition  Airway Management Planned: Oral ETT  Additional Equipment:   Intra-op Plan:   Post-operative Plan: Extubation in OR  Informed Consent: I have reviewed the patients History and Physical, chart, labs and discussed the procedure including the risks, benefits and alternatives for the proposed anesthesia with the patient or authorized representative who has indicated his/her understanding and acceptance.   Dental advisory given  Plan Discussed with: CRNA  Anesthesia Plan Comments:        Anesthesia Quick Evaluation

## 2018-01-15 ENCOUNTER — Observation Stay (HOSPITAL_BASED_OUTPATIENT_CLINIC_OR_DEPARTMENT_OTHER)
Admission: RE | Admit: 2018-01-15 | Discharge: 2018-01-16 | Disposition: A | Discharge status: Home / Self Care | Payer: 59 | Source: Ambulatory Visit | Attending: Obstetrics and Gynecology | Admitting: Obstetrics and Gynecology

## 2018-01-15 ENCOUNTER — Encounter (HOSPITAL_COMMUNITY)
Admission: RE | Disposition: A | Discharge status: Home / Self Care | Payer: Self-pay | Source: Ambulatory Visit | Attending: Obstetrics and Gynecology

## 2018-01-15 ENCOUNTER — Ambulatory Visit (HOSPITAL_BASED_OUTPATIENT_CLINIC_OR_DEPARTMENT_OTHER): Payer: 59 | Admitting: Anesthesiology

## 2018-01-15 ENCOUNTER — Encounter (HOSPITAL_BASED_OUTPATIENT_CLINIC_OR_DEPARTMENT_OTHER): Payer: Self-pay | Admitting: Anesthesiology

## 2018-01-15 ENCOUNTER — Other Ambulatory Visit: Payer: Self-pay

## 2018-01-15 DIAGNOSIS — N72 Inflammatory disease of cervix uteri: Secondary | ICD-10-CM | POA: Insufficient documentation

## 2018-01-15 DIAGNOSIS — N8311 Corpus luteum cyst of right ovary: Secondary | ICD-10-CM | POA: Diagnosis not present

## 2018-01-15 DIAGNOSIS — N8312 Corpus luteum cyst of left ovary: Secondary | ICD-10-CM | POA: Insufficient documentation

## 2018-01-15 DIAGNOSIS — N814 Uterovaginal prolapse, unspecified: Principal | ICD-10-CM | POA: Insufficient documentation

## 2018-01-15 DIAGNOSIS — N840 Polyp of corpus uteri: Secondary | ICD-10-CM | POA: Insufficient documentation

## 2018-01-15 DIAGNOSIS — N8189 Other female genital prolapse: Secondary | ICD-10-CM | POA: Diagnosis not present

## 2018-01-15 DIAGNOSIS — N819 Female genital prolapse, unspecified: Secondary | ICD-10-CM | POA: Diagnosis present

## 2018-01-15 HISTORY — PX: LAPAROSCOPIC VAGINAL HYSTERECTOMY WITH SALPINGO OOPHORECTOMY: SHX6681

## 2018-01-15 HISTORY — PX: ANTERIOR AND POSTERIOR REPAIR: SHX5121

## 2018-01-15 LAB — TYPE AND SCREEN
ABO/RH(D): A POS
Antibody Screen: NEGATIVE

## 2018-01-15 SURGERY — HYSTERECTOMY, VAGINAL, LAPAROSCOPY-ASSISTED, WITH SALPINGO-OOPHORECTOMY
Anesthesia: General

## 2018-01-15 MED ORDER — OXYCODONE HCL 5 MG PO TABS
5.0000 mg | ORAL_TABLET | Freq: Once | ORAL | Status: DC | PRN
  Filled 2018-01-15: qty 1

## 2018-01-15 MED ORDER — SUGAMMADEX SODIUM 200 MG/2ML IV SOLN
INTRAVENOUS | Status: DC | PRN
  Administered 2018-01-15: 200 mg via INTRAVENOUS

## 2018-01-15 MED ORDER — FENTANYL CITRATE (PF) 100 MCG/2ML IJ SOLN
INTRAMUSCULAR | Status: DC | PRN
  Administered 2018-01-15 (×2): 50 ug via INTRAVENOUS

## 2018-01-15 MED ORDER — DEXAMETHASONE SODIUM PHOSPHATE 4 MG/ML IJ SOLN
INTRAMUSCULAR | Status: DC | PRN
  Administered 2018-01-15: 10 mg via INTRAVENOUS

## 2018-01-15 MED ORDER — PROPOFOL 500 MG/50ML IV EMUL
INTRAVENOUS | Status: AC
  Filled 2018-01-15: qty 50

## 2018-01-15 MED ORDER — LIDOCAINE-EPINEPHRINE (PF) 1 %-1:200000 IJ SOLN
INTRAMUSCULAR | Status: DC | PRN
  Administered 2018-01-15: 3 mL

## 2018-01-15 MED ORDER — CELECOXIB 200 MG PO CAPS
200.0000 mg | ORAL_CAPSULE | Freq: Once | ORAL | Status: AC | PRN
  Administered 2018-01-15: 200 mg via ORAL
  Filled 2018-01-15: qty 1

## 2018-01-15 MED ORDER — HYDROMORPHONE HCL 1 MG/ML IJ SOLN
INTRAMUSCULAR | Status: AC
  Filled 2018-01-15: qty 1

## 2018-01-15 MED ORDER — FAMOTIDINE 20 MG PO TABS
20.0000 mg | ORAL_TABLET | Freq: Once | ORAL | Status: AC
  Administered 2018-01-15: 20 mg via ORAL
  Filled 2018-01-15: qty 1

## 2018-01-15 MED ORDER — ACETAMINOPHEN 160 MG/5ML PO SOLN
960.0000 mg | Freq: Once | ORAL | Status: AC
  Filled 2018-01-15: qty 40.6

## 2018-01-15 MED ORDER — ROCURONIUM BROMIDE 10 MG/ML (PF) SYRINGE
PREFILLED_SYRINGE | INTRAVENOUS | Status: DC | PRN
  Administered 2018-01-15: 20 mg via INTRAVENOUS
  Administered 2018-01-15: 40 mg via INTRAVENOUS
  Administered 2018-01-15: 20 mg via INTRAVENOUS

## 2018-01-15 MED ORDER — KETOROLAC TROMETHAMINE 30 MG/ML IJ SOLN
30.0000 mg | Freq: Three times a day (TID) | INTRAMUSCULAR | Status: DC
  Administered 2018-01-15 – 2018-01-16 (×3): 30 mg via INTRAVENOUS
  Filled 2018-01-15 (×4): qty 1

## 2018-01-15 MED ORDER — SCOPOLAMINE 1 MG/3DAYS TD PT72
MEDICATED_PATCH | TRANSDERMAL | Status: AC
  Filled 2018-01-15: qty 1

## 2018-01-15 MED ORDER — ONDANSETRON HCL 4 MG PO TABS
4.0000 mg | ORAL_TABLET | Freq: Four times a day (QID) | ORAL | Status: DC | PRN
  Filled 2018-01-15: qty 1

## 2018-01-15 MED ORDER — SUGAMMADEX SODIUM 200 MG/2ML IV SOLN
INTRAVENOUS | Status: AC
  Filled 2018-01-15: qty 2

## 2018-01-15 MED ORDER — ACETAMINOPHEN 500 MG PO TABS
1000.0000 mg | ORAL_TABLET | Freq: Once | ORAL | Status: AC
  Administered 2018-01-15: 1000 mg via ORAL
  Filled 2018-01-15: qty 2

## 2018-01-15 MED ORDER — FENTANYL CITRATE (PF) 100 MCG/2ML IJ SOLN
INTRAMUSCULAR | Status: AC
  Filled 2018-01-15: qty 2

## 2018-01-15 MED ORDER — LIDOCAINE 2% (20 MG/ML) 5 ML SYRINGE
INTRAMUSCULAR | Status: AC
  Filled 2018-01-15: qty 5

## 2018-01-15 MED ORDER — OXYCODONE HCL 5 MG/5ML PO SOLN
5.0000 mg | Freq: Once | ORAL | Status: DC | PRN
  Filled 2018-01-15: qty 5

## 2018-01-15 MED ORDER — PROPOFOL 10 MG/ML IV BOLUS
INTRAVENOUS | Status: DC | PRN
  Administered 2018-01-15: 150 mg via INTRAVENOUS
  Administered 2018-01-15: 50 mg via INTRAVENOUS

## 2018-01-15 MED ORDER — DEXAMETHASONE SODIUM PHOSPHATE 10 MG/ML IJ SOLN
INTRAMUSCULAR | Status: AC
  Filled 2018-01-15: qty 1

## 2018-01-15 MED ORDER — MIDAZOLAM HCL 2 MG/2ML IJ SOLN
INTRAMUSCULAR | Status: AC
  Filled 2018-01-15: qty 2

## 2018-01-15 MED ORDER — CELECOXIB 200 MG PO CAPS
ORAL_CAPSULE | ORAL | Status: AC
  Filled 2018-01-15: qty 1

## 2018-01-15 MED ORDER — ONDANSETRON HCL 4 MG/2ML IJ SOLN
INTRAMUSCULAR | Status: DC | PRN
  Administered 2018-01-15: 4 mg via INTRAVENOUS

## 2018-01-15 MED ORDER — LIDOCAINE 2% (20 MG/ML) 5 ML SYRINGE
INTRAMUSCULAR | Status: DC | PRN
  Administered 2018-01-15: 80 mg via INTRAVENOUS

## 2018-01-15 MED ORDER — OXYCODONE-ACETAMINOPHEN 5-325 MG PO TABS
1.0000 | ORAL_TABLET | ORAL | Status: DC | PRN
  Administered 2018-01-15: 1 via ORAL
  Filled 2018-01-15: qty 2
  Filled 2018-01-15: qty 1

## 2018-01-15 MED ORDER — ONDANSETRON HCL 4 MG/2ML IJ SOLN
4.0000 mg | Freq: Four times a day (QID) | INTRAMUSCULAR | Status: DC | PRN
  Filled 2018-01-15: qty 2

## 2018-01-15 MED ORDER — GLYCOPYRROLATE 0.2 MG/ML IJ SOLN
INTRAMUSCULAR | Status: DC | PRN
  Administered 2018-01-15: 0.2 mg via INTRAVENOUS

## 2018-01-15 MED ORDER — ACETAMINOPHEN 500 MG PO TABS
ORAL_TABLET | ORAL | Status: AC
  Filled 2018-01-15: qty 2

## 2018-01-15 MED ORDER — MENTHOL 3 MG MT LOZG
1.0000 | LOZENGE | OROMUCOSAL | Status: DC | PRN
  Filled 2018-01-15: qty 9

## 2018-01-15 MED ORDER — HYDROMORPHONE HCL 1 MG/ML IJ SOLN
0.2500 mg | INTRAMUSCULAR | Status: DC | PRN
  Administered 2018-01-15 (×3): 0.5 mg via INTRAVENOUS
  Filled 2018-01-15: qty 0.5

## 2018-01-15 MED ORDER — SODIUM CHLORIDE 0.9 % IV SOLN
INTRAVENOUS | Status: AC
  Filled 2018-01-15: qty 2

## 2018-01-15 MED ORDER — ROCURONIUM BROMIDE 10 MG/ML (PF) SYRINGE
PREFILLED_SYRINGE | INTRAVENOUS | Status: AC
  Filled 2018-01-15: qty 10

## 2018-01-15 MED ORDER — BUPIVACAINE HCL (PF) 0.25 % IJ SOLN
INTRAMUSCULAR | Status: DC | PRN
  Administered 2018-01-15: 3 mL

## 2018-01-15 MED ORDER — LACTATED RINGERS IV SOLN
INTRAVENOUS | Status: DC
  Filled 2018-01-15: qty 1000

## 2018-01-15 MED ORDER — SCOPOLAMINE 1 MG/3DAYS TD PT72
1.0000 | MEDICATED_PATCH | Freq: Once | TRANSDERMAL | Status: DC
  Administered 2018-01-15: 1.5 mg via TRANSDERMAL
  Filled 2018-01-15: qty 1

## 2018-01-15 MED ORDER — LACTATED RINGERS IV SOLN
INTRAVENOUS | Status: DC
  Administered 2018-01-15 (×2): via INTRAVENOUS
  Filled 2018-01-15: qty 1000

## 2018-01-15 MED ORDER — FAMOTIDINE 20 MG PO TABS
ORAL_TABLET | ORAL | Status: AC
  Filled 2018-01-15: qty 1

## 2018-01-15 MED ORDER — ESTRADIOL 0.1 MG/GM VA CREA
TOPICAL_CREAM | VAGINAL | Status: DC | PRN
  Administered 2018-01-15: 1 via VAGINAL

## 2018-01-15 MED ORDER — ONDANSETRON HCL 4 MG/2ML IJ SOLN
INTRAMUSCULAR | Status: AC
  Filled 2018-01-15: qty 2

## 2018-01-15 MED ORDER — ACETAMINOPHEN 325 MG PO TABS
650.0000 mg | ORAL_TABLET | Freq: Four times a day (QID) | ORAL | Status: DC | PRN
  Administered 2018-01-15: 650 mg via ORAL
  Filled 2018-01-15: qty 2

## 2018-01-15 MED ORDER — HYDROMORPHONE HCL 1 MG/ML IJ SOLN
0.5000 mg | INTRAMUSCULAR | Status: DC | PRN
  Filled 2018-01-15: qty 0.5

## 2018-01-15 MED ORDER — MIDAZOLAM HCL 5 MG/5ML IJ SOLN
INTRAMUSCULAR | Status: DC | PRN
  Administered 2018-01-15: 2 mg via INTRAVENOUS

## 2018-01-15 MED ORDER — PROMETHAZINE HCL 25 MG/ML IJ SOLN
6.2500 mg | INTRAMUSCULAR | Status: DC | PRN
  Filled 2018-01-15: qty 1

## 2018-01-15 MED ORDER — SODIUM CHLORIDE 0.9 % IV SOLN
2.0000 g | INTRAVENOUS | Status: AC
  Administered 2018-01-15: 2 g via INTRAVENOUS
  Filled 2018-01-15: qty 2

## 2018-01-15 MED ORDER — SODIUM CHLORIDE 0.9 % IR SOLN
Status: DC | PRN
  Administered 2018-01-15 (×2): 1000 mL

## 2018-01-15 SURGICAL SUPPLY — 72 items
BAG RETRIEVAL 10 (BASKET)
BAG RETRIEVAL 10MM (BASKET)
BARRIER ADHS 3X4 INTERCEED (GAUZE/BANDAGES/DRESSINGS) IMPLANT
BLADE CLIPPER SURG (BLADE) IMPLANT
CANISTER SUCT 3000ML PPV (MISCELLANEOUS) ×4 IMPLANT
CLOSURE WOUND 1/4X4 (GAUZE/BANDAGES/DRESSINGS)
COVER BACK TABLE 60X90IN (DRAPES) ×4 IMPLANT
COVER WAND RF STERILE (DRAPES) ×4 IMPLANT
DECANTER SPIKE VIAL GLASS SM (MISCELLANEOUS) ×4 IMPLANT
DERMABOND ADVANCED (GAUZE/BANDAGES/DRESSINGS) ×2
DERMABOND ADVANCED .7 DNX12 (GAUZE/BANDAGES/DRESSINGS) ×2 IMPLANT
DEVICE CAPIO SLIM SINGLE (INSTRUMENTS) ×4 IMPLANT
DRSG OPSITE POSTOP 3X4 (GAUZE/BANDAGES/DRESSINGS) ×4 IMPLANT
DRSG TEGADERM 2-3/8X2-3/4 SM (GAUZE/BANDAGES/DRESSINGS) IMPLANT
DURAPREP 26ML APPLICATOR (WOUND CARE) ×8 IMPLANT
ELECT REM PT RETURN 9FT ADLT (ELECTROSURGICAL) ×4
ELECTRODE REM PT RTRN 9FT ADLT (ELECTROSURGICAL) ×2 IMPLANT
GAUZE 4X4 16PLY RFD (DISPOSABLE) ×8 IMPLANT
GLOVE BIO SURGEON STRL SZ 6.5 (GLOVE) ×12 IMPLANT
GLOVE BIO SURGEONS STRL SZ 6.5 (GLOVE) ×4
GLOVE BIOGEL PI IND STRL 7.0 (GLOVE) ×8 IMPLANT
GLOVE BIOGEL PI INDICATOR 7.0 (GLOVE) ×8
GLOVE ECLIPSE 6.5 STRL STRAW (GLOVE) ×12 IMPLANT
GOWN STRL REUS W/TWL LRG LVL3 (GOWN DISPOSABLE) ×16 IMPLANT
HOLDER FOLEY CATH W/STRAP (MISCELLANEOUS) ×4 IMPLANT
KIT TURNOVER CYSTO (KITS) ×4 IMPLANT
NEEDLE HYPO 22GX1.5 SAFETY (NEEDLE) ×4 IMPLANT
NEEDLE MAYO 6 CRC TAPER PT (NEEDLE) IMPLANT
NS IRRIG 1000ML POUR BTL (IV SOLUTION) ×4 IMPLANT
NS IRRIG 500ML POUR BTL (IV SOLUTION) ×4 IMPLANT
PACK LAVH (CUSTOM PROCEDURE TRAY) ×4 IMPLANT
PACK TRENDGUARD 450 HYBRID PRO (MISCELLANEOUS) ×2 IMPLANT
PACK VAGINAL WOMENS (CUSTOM PROCEDURE TRAY) ×4 IMPLANT
PACKING VAGINAL (PACKING) ×4 IMPLANT
PAD OB MATERNITY 4.3X12.25 (PERSONAL CARE ITEMS) ×4 IMPLANT
PAD PREP 24X48 CUFFED NSTRL (MISCELLANEOUS) ×4 IMPLANT
SCISSORS LAP 5X35 DISP (ENDOMECHANICALS) IMPLANT
SEALER TISSUE G2 CVD JAW 45CM (ENDOMECHANICALS) ×4 IMPLANT
SET CYSTO W/LG BORE CLAMP LF (SET/KITS/TRAYS/PACK) IMPLANT
SET IRRIG TUBING LAPAROSCOPIC (IRRIGATION / IRRIGATOR) IMPLANT
STRIP CLOSURE SKIN 1/4X4 (GAUZE/BANDAGES/DRESSINGS) IMPLANT
SUT CAPIO ETHIBPND (SUTURE) ×4 IMPLANT
SUT CAPIO POLYGLYCOLIC (SUTURE) IMPLANT
SUT MNCRL 0 1X36 CT-1 (SUTURE) ×2 IMPLANT
SUT MNCRL 0 MO-4 VIOLET 18 CR (SUTURE) ×6 IMPLANT
SUT MNCRL 0 VIOLET 6X18 (SUTURE) ×4 IMPLANT
SUT MON AB 2-0 CT1 27 (SUTURE) ×8 IMPLANT
SUT MON AB 2-0 CT1 36 (SUTURE) ×8 IMPLANT
SUT MON AB 3-0 SH 27 (SUTURE)
SUT MON AB 3-0 SH27 (SUTURE) IMPLANT
SUT MONOCRYL 0 (SUTURE) ×2
SUT MONOCRYL 0 6X18 (SUTURE) ×4
SUT MONOCRYL 0 MO 4 18  CR/8 (SUTURE) ×6
SUT VIC AB 0 CT1 18XCR BRD8 (SUTURE) IMPLANT
SUT VIC AB 0 CT1 8-18 (SUTURE)
SUT VIC AB 3-0 PS2 18 (SUTURE) ×2
SUT VIC AB 3-0 PS2 18XBRD (SUTURE) ×2 IMPLANT
SUT VIC AB 3-0 SH 27 (SUTURE) ×4
SUT VIC AB 3-0 SH 27X BRD (SUTURE) ×4 IMPLANT
SUT VICRYL 0 UR6 27IN ABS (SUTURE) ×4 IMPLANT
SYR 3ML 23GX1 SAFETY (SYRINGE) IMPLANT
SYR BULB IRRIGATION 50ML (SYRINGE) ×4 IMPLANT
SYS BAG RETRIEVAL 10MM (BASKET)
SYSTEM BAG RETRIEVAL 10MM (BASKET) IMPLANT
TOWEL OR 17X24 6PK STRL BLUE (TOWEL DISPOSABLE) ×8 IMPLANT
TRAY FOLEY W/BAG SLVR 14FR (SET/KITS/TRAYS/PACK) ×4 IMPLANT
TRENDGUARD 450 HYBRID PRO PACK (MISCELLANEOUS) ×4
TROCAR OPTI TIP 5M 100M (ENDOMECHANICALS) ×4 IMPLANT
TROCAR XCEL NON-BLD 11X100MML (ENDOMECHANICALS) ×4 IMPLANT
TUBING INSUF HEATED (TUBING) ×4 IMPLANT
WARMER LAPAROSCOPE (MISCELLANEOUS) ×4 IMPLANT
WATER STERILE IRR 500ML POUR (IV SOLUTION) ×4 IMPLANT

## 2018-01-15 NOTE — Progress Notes (Signed)
Subjective:  POD#0 Patient reports incisional pain and tolerating PO.  Ambulating.  No complaints  Objective: I have reviewed patient's vital signs, intake and output and medications.  General: alert and cooperative GI: normal findings: soft, non-tender  PV:  No bleeding   Assessment/Plan: Doing well  D/c foley Advance to PO meds Remove vaginal packing in am    LOS: 0 days    Marylynn Pearson 01/15/2018, 1:31 PM

## 2018-01-15 NOTE — Transfer of Care (Signed)
Immediate Anesthesia Transfer of Care Note  Patient: Holly Bartlett  Procedure(s) Performed: LAPAROSCOPIC ASSISTED VAGINAL HYSTERECTOMY WITH SALPINGO OOPHORECTOMY (Bilateral ) ANTERIOR (CYSTOCELE) AND POSTERIOR REPAIR (RECTOCELE),  sacrospinous ligament suspension (N/A )  Patient Location: PACU  Anesthesia Type:General  Level of Consciousness: awake  Airway & Oxygen Therapy: Patient Spontanous Breathing and Patient connected to nasal cannula oxygen  Post-op Assessment: Report given to RN and Post -op Vital signs reviewed and stable  Post vital signs: Reviewed and stable  Last Vitals:  Vitals Value Taken Time  BP 126/67 01/15/2018  9:43 AM  Temp    Pulse 65 01/15/2018  9:44 AM  Resp 14 01/15/2018  9:44 AM  SpO2 100 % 01/15/2018  9:44 AM  Vitals shown include unvalidated device data.  Last Pain:  Vitals:   01/15/18 0640  TempSrc:   PainSc: 0-No pain      Patients Stated Pain Goal: 4 (35/59/74 1638)  Complications: No apparent anesthesia complications

## 2018-01-15 NOTE — Op Note (Signed)
01/15/2018  9:35 AM  PATIENT:  Holly Bartlett  57 y.o. female  PRE-OPERATIVE DIAGNOSIS:  pelvic organ prolapse  POST-OPERATIVE DIAGNOSIS:  pelvic organ prolapse  PROCEDURE:  Procedure(s) with comments: LAPAROSCOPIC ASSISTED VAGINAL HYSTERECTOMY WITH SALPINGO OOPHORECTOMY (Bilateral) - need bed ANTERIOR (CYSTOCELE) AND POSTERIOR REPAIR (RECTOCELE),  sacrospinous ligament suspension (N/A)  SURGEON:  Surgeon(s) and Role:    * Marylynn Pearson, MD - Primary  PHYSICIAN ASSISTANT: Hope Budds, MD  ANESTHESIA:   general  EBL:  25 mL - surgeon EBL 150cc  BLOOD ADMINISTERED:none  DRAINS: foley catheter   LOCAL MEDICATIONS USED:  MARCAINE    and LIDOCAINE   SPECIMEN:  Uterus, bilateral tubes and ovaries  DISPOSITION OF SPECIMEN:  PATHOLOGY  COUNTS:  YES  DICTATION: .Other Dictation: Dictation Number pending  PLAN OF CARE: Admit for overnight observation  PATIENT DISPOSITION:  PACU - hemodynamically stable.   Delay start of Pharmacological VTE agent (>24hrs) due to surgical blood loss or risk of bleeding: no

## 2018-01-15 NOTE — Anesthesia Procedure Notes (Signed)
Procedure Name: Intubation Date/Time: 01/15/2018 7:34 AM Performed by: Eulas Post, Royale Swamy W, CRNA Pre-anesthesia Checklist: Patient identified, Emergency Drugs available, Suction available and Patient being monitored Patient Re-evaluated:Patient Re-evaluated prior to induction Oxygen Delivery Method: Circle system utilized Preoxygenation: Pre-oxygenation with 100% oxygen Induction Type: IV induction Ventilation: Mask ventilation without difficulty Laryngoscope Size: Miller and 2 Grade View: Grade I Tube type: Oral Tube size: 7.0 mm Number of attempts: 1 Airway Equipment and Method: Stylet and Oral airway Placement Confirmation: ETT inserted through vocal cords under direct vision,  positive ETCO2 and breath sounds checked- equal and bilateral Secured at: 22 cm Tube secured with: Tape Dental Injury: Teeth and Oropharynx as per pre-operative assessment

## 2018-01-15 NOTE — Anesthesia Postprocedure Evaluation (Signed)
Anesthesia Post Note  Patient: Shanie A Graw  Procedure(s) Performed: LAPAROSCOPIC ASSISTED VAGINAL HYSTERECTOMY WITH SALPINGO OOPHORECTOMY (Bilateral ) ANTERIOR (CYSTOCELE) AND POSTERIOR REPAIR (RECTOCELE),  sacrospinous ligament suspension (N/A )     Patient location during evaluation: PACU Anesthesia Type: General Level of consciousness: awake and alert Pain management: pain level controlled Vital Signs Assessment: post-procedure vital signs reviewed and stable Respiratory status: spontaneous breathing, nonlabored ventilation, respiratory function stable and patient connected to nasal cannula oxygen Cardiovascular status: blood pressure returned to baseline and stable Postop Assessment: no apparent nausea or vomiting Anesthetic complications: no    Last Vitals:  Vitals:   01/15/18 1424 01/15/18 1845  BP: 107/67 100/68  Pulse: (!) 57 (!) 44  Resp: 16 16  Temp: 36.5 C 36.9 C  SpO2: 100% 99%    Last Pain:  Vitals:   01/15/18 1845  TempSrc:   PainSc: 0-No pain                 Christeen Lai P Deiondre Harrower

## 2018-01-16 DIAGNOSIS — N72 Inflammatory disease of cervix uteri: Secondary | ICD-10-CM | POA: Diagnosis not present

## 2018-01-16 DIAGNOSIS — N814 Uterovaginal prolapse, unspecified: Secondary | ICD-10-CM | POA: Diagnosis not present

## 2018-01-16 DIAGNOSIS — N8312 Corpus luteum cyst of left ovary: Secondary | ICD-10-CM | POA: Diagnosis not present

## 2018-01-16 DIAGNOSIS — N840 Polyp of corpus uteri: Secondary | ICD-10-CM | POA: Diagnosis not present

## 2018-01-16 DIAGNOSIS — N8311 Corpus luteum cyst of right ovary: Secondary | ICD-10-CM | POA: Diagnosis not present

## 2018-01-16 LAB — CBC
HEMATOCRIT: 32.1 % — AB (ref 36.0–46.0)
HEMOGLOBIN: 10.5 g/dL — AB (ref 12.0–15.0)
MCH: 30.1 pg (ref 26.0–34.0)
MCHC: 32.7 g/dL (ref 30.0–36.0)
MCV: 92 fL (ref 80.0–100.0)
Platelets: 258 10*3/uL (ref 150–400)
RBC: 3.49 MIL/uL — ABNORMAL LOW (ref 3.87–5.11)
RDW: 12.6 % (ref 11.5–15.5)
WBC: 9.7 10*3/uL (ref 4.0–10.5)
nRBC: 0 % (ref 0.0–0.2)

## 2018-01-16 MED ORDER — OXYCODONE-ACETAMINOPHEN 5-325 MG PO TABS: 1.0000 | ORAL_TABLET | ORAL | 0 refills | Status: DC | PRN

## 2018-01-16 MED ORDER — IBUPROFEN 800 MG PO TABS: 800.0000 mg | ORAL_TABLET | Freq: Four times a day (QID) | ORAL | 1 refills | Status: DC | PRN

## 2018-01-16 MED FILL — IBUPROFEN 800 MG TAB: 800 | 7 days supply | Qty: 30 | Fill #0

## 2018-01-16 MED FILL — OXYCODONE-ACETAMINOPHEN 5-3: 5-325 | 2 days supply | Qty: 20 | Fill #0

## 2018-01-16 NOTE — Op Note (Signed)
NAME: Holly Bartlett, [place] MEDICAL RECORD [id number] ACCOUNT [id number] DATE OF BIRTH:[birth year] FACILITY: [facility] LOCATION: [facility] PHYSICIAN:Janiah Devinney, MD  OPERATIVE REPORT  DATE OF PROCEDURE:  01/15/2018  PREOPERATIVE DIAGNOSIS:  Pelvic organ prolapse.  POSTOPERATIVE DIAGNOSIS:  Pelvic organ prolapse.  PROCEDURE: 1.  Laparoscopic assisted vaginal hysterectomy. 2.  Bilateral salpingo-oophorectomy. 3.  Anterior and posterior repair. 4.  Sacrospinous ligament suspension.  SURGEON:  Marylynn Pearson, MD   ASSISTANT:  McComb.  BLOOD LOSS:  150 mL estimated.  SPECIMENS:  Uterus, cervix, bilateral fallopian tubes and ovaries.  COMPLICATIONS:  None.  CONDITION:  Stable to recovery room.  DESCRIPTION OF PROCEDURE:  The patient was taken to the operating room once informed consent was obtained.  She was given general anesthesia, placed in the dorsal lithotomy position using Allen stirrups.  She was prepped and draped sterilely and a Foley  catheter was inserted in her bladder.  Bivalve speculum was placed in the vagina and a Hulka clamp was placed on the cervix.  The speculum was removed and our attention was turned to the abdomen.  An infraumbilical skin incision was made with the scalpel  and extended bluntly to the level of the fascia using a Kelly clamp.  Optical trocar was inserted under direct visualization.  Once intraperitoneal placement was confirmed, CO2 was turned on and a survey was performed.  Uterus appeared normal.  Right  upper quadrant appeared normal.  Fallopian tubes status post partial salpingectomy.  Fimbriated end and ovaries appeared normal bilaterally.  A 5 mm trocar was then inserted under direct visualization in the suprapubic region 2 cm above the pubic  symphysis.  The right fallopian tube and ovary were grasped with a tenaculum and tented upward and toward the midline.  The EnSeal device was then used to clamp, cauterize and cut the infundibulopelvic  ligament.  This was extended down the mesosalpinx  and broad ligament.  The round ligament was then clamped, cauterized and cut using the EnSeal.  Hemostasis was noted and this procedure was repeated on the left side.  All instruments were then removed from the incision and then our attention was turned  to the vagina.  Hulka clamp was removed.  A circumferential incision was made around the cervix using the Bovie.  A weighted speculum was placed in the posterior vagina and the cervix was grasped with a tooth tenaculum.  A circumferential incision was made with the Bovie.  Posterior cul-de-sac  was entered sharply using curved Mayo scissors and a long weighted speculum was placed in the posterior cul-de-sac.  Bilateral uterosacral ligaments were clamped with Heaney clamps, cut and suture ligated.  Hemostasis was noted.  The anterior cul-de-sac  was then entered sharply using Metzenbaum scissors and a Deaver was placed anteriorly.  The uterine arteries and cardinal ligament were serially clamped, cut, and suture ligated bilaterally.  The uterine fundus was then grasped and delivered through the  posterior cul-de-sac.  The remaining pedicles were clamped bilaterally with Heaney clamps and the uterus, fallopian tubes, ovaries and cervix were then amputated and passed off to be sent to pathology.  Remaining pedicles were suture ligated and  hemostasis was noted.  The vaginal cuff was reapproximated using a running locked stitch of Monocryl.  Allis clamps were used to grasp the anterior vaginal mucosa and 1% lidocaine with epinephrine was used to provide local anesthesia and hydrodissection.   An incision was made and the underlying vesicocervical fascia was dissected free using blunt and sharp dissection from the vaginal  mucosa.  Cystocele was imbricated and the vaginal mucosa was reapproximated using a running locked stitch of Monocryl.   The vaginal cuff was then closed using figure-of-eight sutures and  excellent hemostasis was noted.  Our attention was then turned to the posterior vagina.  Allis clamps were used to grasp the vaginal introitus and the apex of the vagina.  A small  incision was made with the scalpel and the underlying tissue was dissected free from the vaginal mucosa.  Sacrospinous ligament was palpated and a suture was placed using the Capio device.  The free needle portion of the suture was then sutured to the  vaginal apex.  The rectocele was imbricated and the vaginal cuff was reapproximated.  Sacrospinous suspension was tied down and the remaining of the vaginal cuff was closed.  Excellent hemostasis was noted and vaginal packing with Estrace cream was  placed in the vagina.  Our attention was then turned to the abdomen.  The abdomen was reinsufflated and survey was performed.  The pelvis was copiously irrigated and all pedicles were noted to be hemostatic.  All instruments and trocars were then removed from the abdomen.  A deep stitch was placed in the umbilical incision  and the skin was closed with Vicryl.  Sponge, lap, needle and instrument counts were correct x2.  She was extubated and taken to the recovery room in stable condition.  TN/NUANCE  D:01/15/2018 T:01/16/2018 JOB:[id number]

## 2018-01-16 NOTE — Discharge Summary (Signed)
Physician Discharge Summary  Patient ID: Holly Bartlett MRN: [id number] DOB/AGE: 10-25-1960 57 y.o.  Admit date: 01/15/2018 Discharge date: 01/16/2018  Admission Diagnoses: pelvic organ prolapse  Discharge Diagnoses:  Active Problems:   Prolapse of female pelvic organs   Discharged Condition: stable  Hospital Course: Pt was admitted for post op care.  Initially pain was controlled with IV meds.  Once she was able to tolerate PO, her IV was saline locked and PO pain meds given.  Foley was discontinued and she was able to ambulate and void without difficulty.  POD1, her vitals and labs were stable.  She was discharged home  Consults: None  Significant Diagnostic Studies: labs: cbc  Treatments: surgery: see op note  Discharge Exam: Blood pressure (!) 94/55, pulse (!) 55, temperature 98.1 F (36.7 C), resp. rate 17, height 5' 1.5" (1.562 m), weight 61.4 kg, SpO2 96 %. General appearance: alert and cooperative GI: normal findings: soft, non-tender Incision/Wound:c/d/i  Disposition: Discharge disposition: 01-Home or Self Care       Discharge Instructions    Diet - low sodium heart healthy   Complete by:  As directed    Increase activity slowly   Complete by:  As directed      Allergies as of 01/16/2018   No Known Allergies     Medication List    STOP taking these medications   acetaminophen 325 MG tablet Commonly known as:  TYLENOL   cephALEXin 500 MG capsule Commonly known as:  KEFLEX   hydroxypropyl methylcellulose / hypromellose 2.5 % ophthalmic solution Commonly known as:  ISOPTO TEARS / GONIOVISC     TAKE these medications   ibuprofen 800 MG tablet Commonly known as:  ADVIL,MOTRIN Take 1 tablet (800 mg total) by mouth every 6 (six) hours as needed. What changed:    medication strength  how much to take  reasons to take this   oxyCODONE-acetaminophen 5-325 MG tablet Commonly known as:  PERCOCET/ROXICET Take 1-2 tablets by mouth every 4  (four) hours as needed for moderate pain or severe pain. What changed:    how much to take  reasons to take this      Follow-up Information    Marylynn Pearson, MD. Schedule an appointment as soon as possible for a visit in 2 week(s).   Specialty:  Obstetrics and Gynecology Contact information: [address], [place] [address] [phone]           Signed: Marylynn Pearson 01/16/2018, 7:45 AM

## 2018-01-17 ENCOUNTER — Encounter (HOSPITAL_BASED_OUTPATIENT_CLINIC_OR_DEPARTMENT_OTHER): Payer: Self-pay | Admitting: Obstetrics and Gynecology

## 2018-01-17 ENCOUNTER — Ambulatory Visit
Admission: RE | Admit: 2018-01-17 | Discharge: 2018-01-17 | Disposition: A | Discharge status: Home / Self Care | Payer: 59 | Source: Ambulatory Visit | Attending: Obstetrics and Gynecology | Admitting: Obstetrics and Gynecology

## 2018-01-17 DIAGNOSIS — R928 Other abnormal and inconclusive findings on diagnostic imaging of breast: Secondary | ICD-10-CM

## 2018-01-17 DIAGNOSIS — N6001 Solitary cyst of right breast: Secondary | ICD-10-CM | POA: Diagnosis not present

## 2018-01-17 DIAGNOSIS — R922 Inconclusive mammogram: Secondary | ICD-10-CM | POA: Diagnosis not present

## 2018-01-24 ENCOUNTER — Other Ambulatory Visit (HOSPITAL_COMMUNITY): Payer: Self-pay | Admitting: Obstetrics and Gynecology

## 2018-01-24 DIAGNOSIS — Z803 Family history of malignant neoplasm of breast: Secondary | ICD-10-CM

## 2018-01-29 DIAGNOSIS — Z09 Encounter for follow-up examination after completed treatment for conditions other than malignant neoplasm: Secondary | ICD-10-CM | POA: Diagnosis not present

## 2018-01-30 ENCOUNTER — Telehealth: Payer: Self-pay | Admitting: Medical

## 2018-01-30 ENCOUNTER — Ambulatory Visit (HOSPITAL_COMMUNITY)
Admission: RE | Admit: 2018-01-30 | Discharge: 2018-01-30 | Disposition: A | Discharge status: Home / Self Care | Payer: 59 | Source: Ambulatory Visit | Attending: Obstetrics and Gynecology | Admitting: Obstetrics and Gynecology

## 2018-01-30 ENCOUNTER — Encounter: Payer: Self-pay | Admitting: Medical

## 2018-01-30 ENCOUNTER — Ambulatory Visit (INDEPENDENT_AMBULATORY_CARE_PROVIDER_SITE_OTHER): Payer: 59 | Admitting: Medical

## 2018-01-30 VITALS — BP 114/81 | HR 58 | Temp 98.3°F | Resp 16 | Ht 62.0 in | Wt 137.2 lb

## 2018-01-30 DIAGNOSIS — Z803 Family history of malignant neoplasm of breast: Secondary | ICD-10-CM | POA: Diagnosis not present

## 2018-01-30 DIAGNOSIS — Z0000 Encounter for general adult medical examination without abnormal findings: Secondary | ICD-10-CM

## 2018-01-30 DIAGNOSIS — N6489 Other specified disorders of breast: Secondary | ICD-10-CM | POA: Diagnosis not present

## 2018-01-30 DIAGNOSIS — N6322 Unspecified lump in the left breast, upper inner quadrant: Secondary | ICD-10-CM | POA: Insufficient documentation

## 2018-01-30 LAB — CBC WITH DIFFERENTIAL/PLATELET
BASOS ABS: 0.1 10*3/uL (ref 0.0–0.1)
Basophils Relative: 1.1 % (ref 0.0–3.0)
Eosinophils Absolute: 0.2 10*3/uL (ref 0.0–0.7)
Eosinophils Relative: 2.2 % (ref 0.0–5.0)
HEMATOCRIT: 39.3 % (ref 36.0–46.0)
HEMOGLOBIN: 13.1 g/dL (ref 12.0–15.0)
LYMPHS PCT: 15.1 % (ref 12.0–46.0)
Lymphs Abs: 1.4 10*3/uL (ref 0.7–4.0)
MCHC: 33.4 g/dL (ref 30.0–36.0)
MCV: 90.8 fl (ref 78.0–100.0)
MONOS PCT: 9.6 % (ref 3.0–12.0)
Monocytes Absolute: 0.9 10*3/uL (ref 0.1–1.0)
NEUTROS ABS: 6.5 10*3/uL (ref 1.4–7.7)
Neutrophils Relative %: 72 % (ref 43.0–77.0)
Platelets: 335 10*3/uL (ref 150.0–400.0)
RBC: 4.32 Mil/uL (ref 3.87–5.11)
RDW: 13.1 % (ref 11.5–15.5)
WBC: 9 10*3/uL (ref 4.0–10.5)

## 2018-01-30 LAB — COMPREHENSIVE METABOLIC PANEL
ALBUMIN: 4.4 g/dL (ref 3.5–5.2)
ALT: 15 U/L (ref 0–35)
AST: 16 U/L (ref 0–37)
Alkaline Phosphatase: 70 U/L (ref 39–117)
BILIRUBIN TOTAL: 0.6 mg/dL (ref 0.2–1.2)
BUN: 16 mg/dL (ref 6–23)
CALCIUM: 10 mg/dL (ref 8.4–10.5)
CO2: 29 mEq/L (ref 19–32)
CREATININE: 0.7 mg/dL (ref 0.40–1.20)
Chloride: 103 mEq/L (ref 96–112)
GFR: 91.41 mL/min (ref >60.00)
Glucose, Bld: 82 mg/dL (ref 70–99)
Potassium: 4.4 mEq/L (ref 3.5–5.1)
Sodium: 139 mEq/L (ref 135–145)
Total Protein: 7 g/dL (ref 6.0–8.3)

## 2018-01-30 LAB — LIPID PANEL
CHOL/HDL RATIO: 3
Cholesterol: 228 mg/dL — ABNORMAL HIGH (ref 0–200)
HDL: 73.8 mg/dL (ref >39.00)
LDL Cholesterol: 134 mg/dL — ABNORMAL HIGH (ref 0–99)
NONHDL: 154.29
TRIGLYCERIDES: 99 mg/dL (ref 0.0–149.0)
VLDL: 19.8 mg/dL (ref 0.0–40.0)

## 2018-01-30 MED ORDER — GADOBUTROL 1 MMOL/ML IV SOLN
6.0000 mL | Freq: Once | INTRAVENOUS | Status: AC | PRN
  Administered 2018-01-30: 6 mL via INTRAVENOUS

## 2018-01-30 NOTE — Progress Notes (Signed)
Subjective:    Patient ID: Holly Bartlett, female    DOB: 1961-01-17, 57 y.o.   MRN: [id number]  HPI  Pt in for first time.  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 She needs pcp. She runs, swims and bikes. About 5 times. Short triathalons. Eats healthy. Married. 3-4 diet cokes a day.  Pt 2 weeks had hysterectomy 2 weeks ago. Ovaries removed.   She states borderline lipid elevation in past. No meds.   Pt is fasting. Will get CPE today.    Review of Systems  Constitutional: Negative for activity change, chills, diaphoresis, fatigue and fever.  HENT: Positive for congestion. Negative for dental problem, ear pain, sinus pressure and sneezing.        Slight nasal congestion.   Respiratory: Negative for cough, chest tightness and shortness of breath.   Cardiovascular: Negative for chest pain, palpitations and leg swelling.  Gastrointestinal: Negative for abdominal pain, blood in stool, nausea and vomiting.  Genitourinary: Negative for decreased urine volume, dyspareunia, dysuria and pelvic pain.  Musculoskeletal: Negative for neck pain and neck stiffness.  Skin: Negative for rash.  Neurological: Negative for dizziness, syncope, speech difficulty, weakness, light-headedness and headaches.  Psychiatric/Behavioral: Negative for agitation, behavioral problems and confusion. The patient is not nervous/anxious.     Past Medical History:  Diagnosis Date  . Medical history non-contributory   . Prolapse of female pelvic organs   . Slow heart rate    01-11-18 HR 50 ; aymptomatic   . Wears glasses      Social History   Socioeconomic History  . Marital status: Married    Spouse name: Not on file  . Number of children: Not on file  . Years of education: Not on file  . Highest education level: Not on file  Occupational History  . Not on file  Social Needs  . Financial resource strain: Not on file  . Food insecurity:    Worry: Not on file    Inability: Not on file  .  Transportation needs:    Medical: Not on file    Non-medical: Not on file  Tobacco Use  . Smoking status: Never Smoker  . Smokeless tobacco: Never Used  Substance and Sexual Activity  . Alcohol use: Yes    Comment: 1-2 beers a week.  . Drug use: No  . Sexual activity: Yes  Lifestyle  . Physical activity:    Days per week: Not on file    Minutes per session: Not on file  . Stress: Not on file  Relationships  . Social connections:    Talks on phone: Not on file    Gets together: Not on file    Attends religious service: Not on file    Active member of club or organization: Not on file    Attends meetings of clubs or organizations: Not on file    Relationship status: Not on file  . Intimate partner violence:    Fear of current or ex partner: Not on file    Emotionally abused: Not on file    Physically abused: Not on file    Forced sexual activity: Not on file  Other Topics Concern  . Not on file  Social History Narrative  . Not on file    Past Surgical History:  Procedure Laterality Date  . ANTERIOR AND POSTERIOR REPAIR N/A 01/15/2018   Procedure: ANTERIOR (CYSTOCELE) AND POSTERIOR REPAIR (RECTOCELE),  sacrospinous ligament suspension;  Surgeon: Marylynn Pearson, MD;  Location: [facility];  Service: Gynecology;  Laterality: N/A;  . BUNIONECTOMY Right 03/05/2014   Procedure: Altamese McGrew RIGHT FOOT;  Surgeon: Harriet Masson, DPM;  Location: [facility];  Service: Podiatry;  Laterality: Right;  . DENTAL SURGERY     wisdom teeth  . LAPAROSCOPIC VAGINAL HYSTERECTOMY WITH SALPINGO OOPHORECTOMY Bilateral 01/15/2018   Procedure: LAPAROSCOPIC ASSISTED VAGINAL HYSTERECTOMY WITH SALPINGO OOPHORECTOMY;  Surgeon: Marylynn Pearson, MD;  Location: [facility];  Service: Gynecology;  Laterality: Bilateral;  need bed  . TONSILLECTOMY      Family History  Problem Relation Age of Onset  . Diabetes Mother   . Hypertension Mother   .  Cancer Mother   . Diabetes Father   . Heart disease Father     No Known Allergies  No current outpatient medications on file prior to visit.   No current facility-administered medications on file prior to visit.     BP 114/81   Pulse (!) 58   Temp 98.3 F (36.8 C) (Oral)   Resp 16   Ht 5\' 2"  (1.575 m)   Wt 137 lb 3.2 oz (62.2 kg)   SpO2 100%   PF 114 L/min   BMI 25.09 kg/m       Objective:   Physical Exam  General Mental Status- Alert. General Appearance- Not in acute distress.   Skin General: Color- Normal Color. Moisture- Normal Moisture. No worrisome lesions.  Neck Carotid Arteries- Normal color. Moisture- Normal Moisture. No carotid bruits. No JVD.  Chest and Lung Exam Auscultation: Breath Sounds:-Normal.  Cardiovascular Auscultation:Rythm- Regular. Murmurs & Other Heart Sounds:Auscultation of the heart reveals- No Murmurs.  Abdomen Inspection:-Inspeection Normal. Palpation/Percussion:Note:No mass. Palpation and Percussion of the abdomen reveal- Non Tender, Non Distended + BS, no rebound or guarding.   Neurologic Cranial Nerve exam:- CN III-XII intact(No nystagmus), symmetric smile. Strength:- 5/5 equal and symmetric strength both upper and lower extremities.  Marland Kitchen HEENT Head- Normal. Ear Auditory Canal - Left- Normal. Right - Normal.Tympanic Membrane- Left- Normal. Right- Normal. Eye Sclera/Conjunctiva- Left- Normal. Right- Normal. Nose & Sinuses Nasal Mucosa- Left-  Boggy and Congested. Right-  Boggy and  Congested.Bilateral no  maxillary and  No frontal sinus pressure. Mouth & Throat Lips: Upper Lip- Normal: no dryness, cracking, pallor, cyanosis, or vesicular eruption. Lower Lip-Normal: no dryness, cracking, pallor, cyanosis or vesicular eruption. Buccal Mucosa- Bilateral- No Aphthous ulcers. Oropharynx- No Discharge or Erythema. Tonsils: Characteristics- Bilateral- No Erythema or Congestion. Size/Enlargement- Bilateral- No enlargement.  Discharge- bilateral-None.          Assessment & Plan:  For you wellness exam today I have ordered cbc, cmp lipid panel, and ua.  Up to date on vaccines.  Recommend exercise and healthy diet.  We will let you know lab results as they come in.  Follow up date appointment will be determined after lab review.    Mackie Pai, PA-C

## 2018-01-30 NOTE — Patient Instructions (Addendum)
For you wellness exam today I have ordered cbc, cmp, lipid panel, and ua.  Up to date on vaccines.  Recommend exercise and healthy diet.  We will let you know lab results as they come in.  Follow up date appointment will be determined after lab review.    Preventive Care 40-64 Years, Female Preventive care refers to lifestyle choices and visits with your health care provider that can promote health and wellness. What does preventive care include?  A yearly physical exam. This is also called an annual well check.  Dental exams once or twice a year.  Routine eye exams. Ask your health care provider how often you should have your eyes checked.  Personal lifestyle choices, including: ? Daily care of your teeth and gums. ? Regular physical activity. ? Eating a healthy diet. ? Avoiding tobacco and drug use. ? Limiting alcohol use. ? Practicing safe sex. ? Taking low-dose aspirin daily starting at age 7. ? Taking vitamin and mineral supplements as recommended by your health care provider. What happens during an annual well check? The services and screenings done by your health care provider during your annual well check will depend on your age, overall health, lifestyle risk factors, and family history of disease. Counseling Your health care provider may ask you questions about your:  Alcohol use.  Tobacco use.  Drug use.  Emotional well-being.  Home and relationship well-being.  Sexual activity.  Eating habits.  Work and work Statistician.  Method of birth control.  Menstrual cycle.  Pregnancy history.  Screening You may have the following tests or measurements:  Height, weight, and BMI.  Blood pressure.  Lipid and cholesterol levels. These may be checked every 5 years, or more frequently if you are over 75 years old.  Skin check.  Lung cancer screening. You may have this screening every year starting at age 65 if you have a 30-pack-year history of  smoking and currently smoke or have quit within the past 15 years.  Fecal occult blood test (FOBT) of the stool. You may have this test every year starting at age 60.  Flexible sigmoidoscopy or colonoscopy. You may have a sigmoidoscopy every 5 years or a colonoscopy every 10 years starting at age 42.  Hepatitis C blood test.  Hepatitis B blood test.  Sexually transmitted disease (STD) testing.  Diabetes screening. This is done by checking your blood sugar (glucose) after you have not eaten for a while (fasting). You may have this done every 1-3 years.  Mammogram. This may be done every 1-2 years. Talk to your health care provider about when you should start having regular mammograms. This may depend on whether you have a family history of breast cancer.  BRCA-related cancer screening. This may be done if you have a family history of breast, ovarian, tubal, or peritoneal cancers.  Pelvic exam and Pap test. This may be done every 3 years starting at age 64. Starting at age 89, this may be done every 5 years if you have a Pap test in combination with an HPV test.  Bone density scan. This is done to screen for osteoporosis. You may have this scan if you are at high risk for osteoporosis.  Discuss your test results, treatment options, and if necessary, the need for more tests with your health care provider. Vaccines Your health care provider may recommend certain vaccines, such as:  Influenza vaccine. This is recommended every year.  Tetanus, diphtheria, and acellular pertussis (Tdap, Td) vaccine. You  may need a Td booster every 10 years.  Varicella vaccine. You may need this if you have not been vaccinated.  Zoster vaccine. You may need this after age 38.  Measles, mumps, and rubella (MMR) vaccine. You may need at least one dose of MMR if you were born in 1957 or later. You may also need a second dose.  Pneumococcal 13-valent conjugate (PCV13) vaccine. You may need this if you have  certain conditions and were not previously vaccinated.  Pneumococcal polysaccharide (PPSV23) vaccine. You may need one or two doses if you smoke cigarettes or if you have certain conditions.  Meningococcal vaccine. You may need this if you have certain conditions.  Hepatitis A vaccine. You may need this if you have certain conditions or if you travel or work in places where you may be exposed to hepatitis A.  Hepatitis B vaccine. You may need this if you have certain conditions or if you travel or work in places where you may be exposed to hepatitis B.  Haemophilus influenzae type b (Hib) vaccine. You may need this if you have certain conditions.  Talk to your health care provider about which screenings and vaccines you need and how often you need them. This information is not intended to replace advice given to you by your health care provider. Make sure you discuss any questions you have with your health care provider. Document Released: 03/13/2015 Document Revised: 11/04/2015 Document Reviewed: 12/16/2014 Elsevier Interactive Patient Education  [employer].

## 2018-01-30 NOTE — Telephone Encounter (Signed)
Would you please result this patient urine for her cpe.

## 2018-01-31 ENCOUNTER — Ambulatory Visit: Payer: 59 | Admitting: Family Medicine

## 2018-01-31 LAB — POC URINALSYSI DIPSTICK (AUTOMATED)
BILIRUBIN UA: NEGATIVE
Glucose, UA: NEGATIVE
KETONES UA: NEGATIVE
LEUKOCYTES UA: NEGATIVE
Nitrite, UA: NEGATIVE
PH UA: 6 (ref 5.0–8.0)
PROTEIN UA: NEGATIVE
RBC UA: NEGATIVE
Spec Grav, UA: >=1.03 — AB (ref 1.010–1.025)
Urobilinogen, UA: NEGATIVE E.U./dL — AB

## 2018-01-31 NOTE — Addendum Note (Signed)
Addended by: Hinton Dyer on: 01/31/2018 12:20 PM   Modules accepted: Orders

## 2018-01-31 NOTE — Telephone Encounter (Signed)
Spoke to [place], [place], who stated she would address today.

## 2018-02-01 ENCOUNTER — Other Ambulatory Visit: Payer: Self-pay | Admitting: Obstetrics and Gynecology

## 2018-02-01 DIAGNOSIS — R9389 Abnormal findings on diagnostic imaging of other specified body structures: Secondary | ICD-10-CM

## 2018-02-02 ENCOUNTER — Ambulatory Visit: Payer: Self-pay

## 2018-02-02 ENCOUNTER — Telehealth: Payer: Self-pay | Admitting: Medical

## 2018-02-02 MED ORDER — FLUTICASONE PROPIONATE 50 MCG/ACT NA SUSP: 2.0000 | Freq: Every day | NASAL | 0 refills | Status: DC

## 2018-02-02 MED ORDER — AMOXICILLIN-POT CLAVULANATE 875-125 MG PO TABS: 1.0000 | ORAL_TABLET | Freq: Two times a day (BID) | ORAL | 0 refills | Status: DC

## 2018-02-02 NOTE — Telephone Encounter (Signed)
Patient is calling because she saw Percell Miller on Tuesday this week for a CPE. She was starting to come down with a cold. She is getting worse.And has a bad headache above her right eye. The patient is requesting advice on medication that she can take. Or requesting medication to be called in to a pharmacy. [place] [address] [phone]   Pt. Reports dry cough, sore throat, runny nose with yellow discharge. Symptoms were starting when she saw him in the office.

## 2018-02-02 NOTE — Telephone Encounter (Signed)
Opened to sent in rx to pt pharmacy.

## 2018-02-02 NOTE — Telephone Encounter (Signed)
Could not find pt pharmacy in [place]. Looks like may have sent to wrong pharmacy but then can't find exact address she gave me./

## 2018-02-02 NOTE — Telephone Encounter (Signed)
I sent in augmentin and flonase for possible sinus infection. Confirmed and called her pharmacy their. If not improving with this treatment while in [place] be seen by local provider there.

## 2018-02-08 ENCOUNTER — Other Ambulatory Visit (HOSPITAL_COMMUNITY): Payer: Self-pay | Admitting: Diagnostic Radiology

## 2018-02-08 ENCOUNTER — Ambulatory Visit
Admission: RE | Admit: 2018-02-08 | Discharge: 2018-02-08 | Disposition: A | Discharge status: Home / Self Care | Payer: 59 | Source: Ambulatory Visit | Attending: Obstetrics and Gynecology | Admitting: Obstetrics and Gynecology

## 2018-02-08 DIAGNOSIS — R9389 Abnormal findings on diagnostic imaging of other specified body structures: Secondary | ICD-10-CM

## 2018-02-08 DIAGNOSIS — R928 Other abnormal and inconclusive findings on diagnostic imaging of breast: Secondary | ICD-10-CM | POA: Diagnosis not present

## 2018-02-08 DIAGNOSIS — D1809 Hemangioma of other sites: Secondary | ICD-10-CM | POA: Diagnosis not present

## 2018-02-08 DIAGNOSIS — N6322 Unspecified lump in the left breast, upper inner quadrant: Secondary | ICD-10-CM | POA: Diagnosis not present

## 2018-02-08 MED ORDER — GADOBUTROL 1 MMOL/ML IV SOLN
6.0000 mL | Freq: Once | INTRAVENOUS | Status: AC | PRN
  Administered 2018-02-08: 6 mL via INTRAVENOUS

## 2018-04-16 ENCOUNTER — Encounter: Payer: Self-pay | Admitting: Physician Assistant

## 2018-04-16 ENCOUNTER — Telehealth: Payer: 59 | Admitting: Physician Assistant

## 2018-04-16 ENCOUNTER — Encounter: Payer: Self-pay | Admitting: Medical

## 2018-04-16 ENCOUNTER — Ambulatory Visit: Payer: 59 | Admitting: Medical

## 2018-04-16 ENCOUNTER — Ambulatory Visit (INDEPENDENT_AMBULATORY_CARE_PROVIDER_SITE_OTHER): Payer: Self-pay | Admitting: Nurse Practitioner

## 2018-04-16 VITALS — BP 118/82 | HR 55 | Temp 99.3°F | Resp 17 | Wt 140.0 lb

## 2018-04-16 DIAGNOSIS — R509 Fever, unspecified: Secondary | ICD-10-CM

## 2018-04-16 DIAGNOSIS — J111 Influenza due to unidentified influenza virus with other respiratory manifestations: Secondary | ICD-10-CM

## 2018-04-16 DIAGNOSIS — R05 Cough: Secondary | ICD-10-CM

## 2018-04-16 DIAGNOSIS — R059 Cough, unspecified: Secondary | ICD-10-CM

## 2018-04-16 MED ORDER — PROMETHAZINE-DM 6.25-15 MG/5ML PO SYRP: 5.0000 mL | ORAL_SOLUTION | Freq: Four times a day (QID) | ORAL | 0 refills | Status: DC | PRN

## 2018-04-16 MED ORDER — ALBUTEROL SULFATE HFA 108 (90 BASE) MCG/ACT IN AERS: 2.0000 | INHALATION_SPRAY | Freq: Four times a day (QID) | RESPIRATORY_TRACT | 0 refills | Status: DC | PRN

## 2018-04-16 MED FILL — PROMETHAZINE W/DM SYRUP: 6.25-15 | 7 days supply | Qty: 140 | Fill #0

## 2018-04-16 MED FILL — VENTOLIN HFA 90 MCG INHALER: 108 (90 BAS | 25 days supply | Qty: 18 | Fill #0

## 2018-04-16 NOTE — Telephone Encounter (Signed)
See E-visit

## 2018-04-16 NOTE — Patient Instructions (Signed)
Influenza, Adult -Take medication as prescribed. -Ibuprofen or Tylenol for pain, fever, or general discomfort. -Increase fluids. -Get plenty of rest. -Sleep elevated on at least 2 pillows at bedtime to help with cough. -Use a humidifier or vaporizer when at home and during sleep to help with cough. -May use a teaspoon of honey or over-the-counter cough drops to help with cough. -Remain home until you have been fever-free for at least 48 hours without the use of Ibuprofen or Tylenol. -You may return to work on Friday, April 20, 2018.  If you need to be out of work longer, please follow up with your PCP. -Follow-up if symptoms do not improve.   Influenza, more commonly known as "the flu," is a viral infection that mainly affects the respiratory tract. The respiratory tract includes organs that help you breathe, such as the lungs, nose, and throat. The flu causes many symptoms similar to the common cold along with high fever and body aches. The flu spreads easily from person to person (is contagious). Getting a flu shot (influenza vaccination) every year is the best way to prevent the flu. What are the causes? This condition is caused by the influenza virus. You can get the virus by:  Breathing in droplets that are in the air from an infected person's cough or sneeze.  Touching something that has been exposed to the virus (has been contaminated) and then touching your mouth, nose, or eyes. What increases the risk? The following factors may make you more likely to get the flu:  Not washing or sanitizing your hands often.  Having close contact with many people during cold and flu season.  Touching your mouth, eyes, or nose without first washing or sanitizing your hands.  Not getting a yearly (annual) flu shot. You may have a higher risk for the flu, including serious problems such as a lung infection (pneumonia), if you:  Are older than 65.  Are pregnant.  Have a weakened  disease-fighting system (immune system). You may have a weakened immune system if you: ? Have HIV or AIDS. ? Are undergoing chemotherapy. ? Are taking medicines that reduce (suppress) the activity of your immune system.  Have a long-term (chronic) illness, such as heart disease, kidney disease, diabetes, or lung disease.  Have a liver disorder.  Are severely overweight (morbidly obese).  Have anemia. This is a condition that affects your red blood cells.  Have asthma. What are the signs or symptoms? Symptoms of this condition usually begin suddenly and last 4-14 days. They may include:  Fever and chills.  Headaches, body aches, or muscle aches.  Sore throat.  Cough.  Runny or stuffy (congested) nose.  Chest discomfort.  Poor appetite.  Weakness or fatigue.  Dizziness.  Nausea or vomiting. How is this diagnosed? This condition may be diagnosed based on:  Your symptoms and medical history.  A physical exam.  Swabbing your nose or throat and testing the fluid for the influenza virus. How is this treated? If the flu is diagnosed early, you can be treated with medicine that can help reduce how severe the illness is and how long it lasts (antiviral medicine). This may be given by mouth (orally) or through an IV. Taking care of yourself at home can help relieve symptoms. Your health care provider may recommend:  Taking over-the-counter medicines.  Drinking plenty of fluids. In many cases, the flu goes away on its own. If you have severe symptoms or complications, you may be treated in a  hospital. Follow these instructions at home: Activity  Rest as needed and get plenty of sleep.  Stay home from work or school as told by your health care provider. Unless you are visiting your health care provider, avoid leaving home until your fever has been gone for 24 hours without taking medicine. Eating and drinking  Take an oral rehydration solution (ORS). This is a drink  that is sold at pharmacies and retail stores.  Drink enough fluid to keep your urine pale yellow.  Drink clear fluids in small amounts as you are able. Clear fluids include water, ice chips, diluted fruit juice, and low-calorie sports drinks.  Eat bland, easy-to-digest foods in small amounts as you are able. These foods include bananas, applesauce, rice, lean meats, toast, and crackers.  Avoid drinking fluids that contain a lot of sugar or caffeine, such as energy drinks, regular sports drinks, and soda.  Avoid alcohol.  Avoid spicy or fatty foods. General instructions      Take over-the-counter and prescription medicines only as told by your health care provider.  Use a cool mist humidifier to add humidity to the air in your home. This can make it easier to breathe.  Cover your mouth and nose when you cough or sneeze.  Wash your hands with soap and water often, especially after you cough or sneeze. If soap and water are not available, use alcohol-based hand sanitizer.  Keep all follow-up visits as told by your health care provider. This is important. How is this prevented?   Get an annual flu shot. You may get the flu shot in late summer, fall, or winter. Ask your health care provider when you should get your flu shot.  Avoid contact with people who are sick during cold and flu season. This is generally fall and winter. Contact a health care provider if:  You develop new symptoms.  You have: ? Chest pain. ? Diarrhea. ? A fever.  Your cough gets worse.  You produce more mucus.  You feel nauseous or you vomit. Get help right away if:  You develop shortness of breath or difficulty breathing.  Your skin or nails turn a bluish color.  You have severe pain or stiffness in your neck.  You develop a sudden headache or sudden pain in your face or ear.  You cannot eat or drink without vomiting. Summary  Influenza, more commonly known as "the flu," is a viral  infection that primarily affects your respiratory tract.  Symptoms of the flu usually begin suddenly and last 4-14 days.  Getting an annual flu shot is the best way to prevent getting the flu.  Stay home from work or school as told by your health care provider. Unless you are visiting your health care provider, avoid leaving home until your fever has been gone for 24 hours without taking medicine.  Keep all follow-up visits as told by your health care provider. This is important. This information is not intended to replace advice given to you by your health care provider. Make sure you discuss any questions you have with your health care provider. Document Released: 02/12/2000 Document Revised: 08/02/2017 Document Reviewed: 08/02/2017 Elsevier Interactive Patient Education  2019 [employer].

## 2018-04-16 NOTE — Progress Notes (Signed)
Based on what you shared with me it looks like you have a serious condition that should be evaluated in a face to face office visit.   Ms Holly Bartlett,  It is best to have your symptoms evaluated in a face to face visit to have a lung examination to make sure pneumonia is excluded. The persistent fever, cough, and now extra sounds in the lungs may be a sign of pneumonia. Let me know if you have any questions or concerns. Hope you feel better !  NOTE: If you entered your credit card information for this eVisit, you will not be charged. You may see a "hold" on your card for the $30 but that hold will drop off and you will not have a charge processed.  If you are having a true medical emergency please call 911.  If you need an urgent face to face visit, [place] has four urgent care centers for your convenience.  If you need care fast and have a high deductible or no insurance consider:   [email or web address] to reserve your spot online an avoid wait times  [facility] [address] [place], [place] [address] 8 am to 8 pm Monday-Friday 10 am to 4 pm Saturday-Sunday *Across the street from [employer]  [place], [address] 8 am to 5 pm Monday-Friday * In the [facility] on the [facility]   The following sites will take your  insurance:  . [facility] [place] a Provider at this Location  [address] [place], [place] [address] . 10 am to 8 pm Monday-Friday . 12 pm to 8 pm Saturday-Sunday   . [facility] at [place] a Provider at this Location  [place], [place], [place] [address] . 8 am to 8 pm Monday-Friday . 9 am to 6 pm Saturday . 11 am to 6 pm Sunday   . [facility] [place] Get Driving Directions  [address] [place], [place] [address] . 8 am to 8 pm Monday-Friday . 8 am to 4 pm Saturday-Sunday   Your e-visit answers were reviewed by a board certified advanced clinical practitioner to complete your personal care plan.  Thank you for using e-Visits.

## 2018-04-16 NOTE — Progress Notes (Signed)
Subjective:     Holly Bartlett is a 58 y.o. female who presents for evaluation of influenza like symptoms. Symptoms include fevers up to 102 degrees, chills, headache, myalgias, post nasal drip, productive cough and fatigue, nasal congestion, sore throat with coughing and have been present for 5 days.  Patient informs today is the first day she has started to feel better.  Patient currently denies fever, chills, sinus pain, sinus pressure, ear pain, ear drainage, shortness of breath, wheezing, difficulty breathing.  She has tried to alleviate the symptoms with Tylenol, Mucinez, Percocet, humidifier and a nasal decongestant with minimal relief.  The patient informs she did have an influenza vaccine this flu season.  High risk factors for influenza complications: none.  I am unsure of the true onset of the patient's symptoms as she did an ED visit indicating that her symptoms started 7 days ago, but reported to this provider that her symptoms started on Wednesday of last week, which was 5 days ago.  The following portions of the patient's history were reviewed and updated as appropriate: allergies, current medications and past medical history.  Review of Systems Constitutional: positive for anorexia, chills, fatigue, fevers and malaise, negative for sweats and weight loss Eyes: negative Ears, nose, mouth, throat, and face: positive for nasal congestion and sore throat, negative for ear drainage, earaches and hoarseness Respiratory: positive for cough and sputum, negative for asthma, chronic bronchitis, dyspnea on exertion, pneumonia, stridor and wheezing Cardiovascular: negative Gastrointestinal: positive for decreased appetite, negative for abdominal pain, constipation, diarrhea, nausea and vomiting Neurological: positive for headaches, negative for coordination problems, dizziness, paresthesia, tremors, vertigo and weakness     Objective:    BP 118/82 (BP Location: Right Arm, Patient  Position: Sitting, Cuff Size: Normal)   Pulse (!) 55   Temp 99.3 F (37.4 C) (Oral)   Resp 17   Wt 140 lb (63.5 kg)   SpO2 99%   BMI 25.61 kg/m   Physical Exam Vitals signs reviewed.  Constitutional:      General: She is not in acute distress. HENT:     Head: Normocephalic.     Right Ear: Tympanic membrane, ear canal and external ear normal.     Left Ear: Tympanic membrane, ear canal and external ear normal.     Nose: Mucosal edema and rhinorrhea present. No congestion.     Right Turbinates: Enlarged and swollen.     Left Turbinates: Enlarged and swollen.     Right Sinus: No maxillary sinus tenderness or frontal sinus tenderness.     Left Sinus: No maxillary sinus tenderness or frontal sinus tenderness.     Mouth/Throat:     Lips: Pink.     Mouth: Mucous membranes are moist.     Pharynx: Uvula midline. Posterior oropharyngeal erythema present. No pharyngeal swelling, oropharyngeal exudate or uvula swelling.     Tonsils: No tonsillar exudate. Swelling: 0 on the right. 0 on the left.  Eyes:     Pupils: Pupils are equal, round, and reactive to light.  Neck:     Musculoskeletal: Normal range of motion and neck supple.  Cardiovascular:     Rate and Rhythm: Normal rate and regular rhythm.     Pulses: Normal pulses.     Heart sounds: Normal heart sounds.  Pulmonary:     Effort: Pulmonary effort is normal. No respiratory distress.     Breath sounds: Normal breath sounds. No stridor. No wheezing, rhonchi or rales.  Abdominal:  General: Abdomen is flat. Bowel sounds are normal.     Tenderness: There is no abdominal tenderness.  Lymphadenopathy:     Cervical: No cervical adenopathy.  Skin:    General: Skin is warm and dry.     Capillary Refill: Capillary refill takes less than 2 seconds.  Neurological:     General: No focal deficit present.     Mental Status: She is alert and oriented to person, place, and time.     Cranial Nerves: No cranial nerve deficit.  Psychiatric:         Mood and Affect: Mood normal.        Thought Content: Thought content normal.     Assessment:   Influenza   Plan:   Exam findings, diagnosis etiology and medication use and indications reviewed with patient. Follow- Up and discharge instructions provided. No emergent/urgent issues found on exam.  Based on the patient's clinical presentation, symptoms, and physical assessment, patient's findings are congruent with that of viral etiology.  Patient does meet clinical criteria for clinical diagnosis of influenza.  I did test the patient's husband who was a confirmed influenza A while in the office.  Patient and husband both informed that patient began with her symptoms first.  I will provide the patient with symptomatic treatment as she has passed the threshold for Tamiflu.  Will provide symptomatic treatment to include Promethazine DM and an albuterol inhaler.  Also discussed with the patient to continue symptomatic treatment to include ibuprofen or Tylenol, increasing fluids, getting plenty of rest, sleeping elevated, and with a humidifier.  I am reassured as patient informs this is the first day she has begin to feel better.  We will write the patient out for 2 days of work, informed the patient that if she needs to be out longer, she will need to follow-up with her PCP as we do not complete FMLA paperwork at this office.  Patient education was provided. Patient verbalized understanding of information provided and agrees with plan of care (POC), all questions answered. The patient is advised to call or return to clinic if condition does not see an improvement in symptoms, or to seek the care of the closest emergency department if condition worsens with the above plan.   1. Influenza  - albuterol (PROVENTIL HFA;VENTOLIN HFA) 108 (90 Base) MCG/ACT inhaler; Inhale 2 puffs into the lungs every 6 (six) hours as needed for up to 10 days.  Dispense: 1 Inhaler; Refill: 0 - promethazine-dextromethorphan  (PROMETHAZINE-DM) 6.25-15 MG/5ML syrup; Take 5 mLs by mouth 4 (four) times daily as needed for up to 7 days.  Dispense: 140 mL; Refill: 0 -Take medication as prescribed. -Ibuprofen or Tylenol for pain, fever, or general discomfort. -Increase fluids. -Get plenty of rest. -Sleep elevated on at least 2 pillows at bedtime to help with cough. -Use a humidifier or vaporizer when at home and during sleep to help with cough. -May use a teaspoon of honey or over-the-counter cough drops to help with cough. -Remain home until you have been fever-free for at least 48 hours without the use of Ibuprofen or Tylenol. -You may return to work on Friday, April 20, 2018.  If you need to be out of work longer, please follow up with your PCP. -Follow-up if symptoms do not improve.

## 2018-04-17 ENCOUNTER — Ambulatory Visit (INDEPENDENT_AMBULATORY_CARE_PROVIDER_SITE_OTHER): Payer: Self-pay

## 2018-04-17 DIAGNOSIS — Z0000 Encounter for general adult medical examination without abnormal findings: Secondary | ICD-10-CM

## 2018-04-18 ENCOUNTER — Telehealth: Payer: Self-pay

## 2018-04-18 NOTE — Telephone Encounter (Signed)
Called and lm on pt vm tcb regarding how she is feeling since her visit Monday.

## 2018-04-19 ENCOUNTER — Ambulatory Visit: Payer: 59 | Admitting: Medical

## 2018-04-19 ENCOUNTER — Encounter: Payer: Self-pay | Admitting: Medical

## 2018-04-19 VITALS — BP 126/76 | HR 56 | Temp 98.5°F | Resp 16 | Ht 62.0 in | Wt 137.6 lb

## 2018-04-19 DIAGNOSIS — J3489 Other specified disorders of nose and nasal sinuses: Secondary | ICD-10-CM | POA: Diagnosis not present

## 2018-04-19 DIAGNOSIS — R05 Cough: Secondary | ICD-10-CM

## 2018-04-19 DIAGNOSIS — R059 Cough, unspecified: Secondary | ICD-10-CM

## 2018-04-19 DIAGNOSIS — J40 Bronchitis, not specified as acute or chronic: Secondary | ICD-10-CM

## 2018-04-19 MED ORDER — DOXYCYCLINE HYCLATE 100 MG PO TABS: 100.0000 mg | ORAL_TABLET | Freq: Two times a day (BID) | ORAL | 0 refills | Status: DC

## 2018-04-19 MED ORDER — FLUTICASONE PROPIONATE 50 MCG/ACT NA SUSP: 2.0000 | Freq: Every day | NASAL | 1 refills | Status: DC

## 2018-04-19 MED ORDER — HYDROCODONE-HOMATROPINE 5-1.5 MG/5ML PO SYRP: 5.0000 mL | ORAL_SOLUTION | Freq: Four times a day (QID) | ORAL | 0 refills | Status: DC | PRN

## 2018-04-19 MED FILL — DOXYCYCLINE HYCLATE 100 MG: 100 | 10 days supply | Qty: 20 | Fill #0

## 2018-04-19 MED FILL — FLUTICASONE PROP 50 MCG SPR: 50 | 30 days supply | Qty: 16 | Fill #0

## 2018-04-19 MED FILL — HYDROCODONE-HOMATROPINE SYR: 5-1.5 | 5 days supply | Qty: 100 | Fill #0

## 2018-04-19 NOTE — Patient Instructions (Signed)
You do appear to have had the flu last week and now presenting with bronchitis with possible sinus infection.  Will prescribe doxycycline antibiotic, Flonase for nasal congestion and Hycodan for cough.  Rx advisement given on meds.  If your chest congestion cough or fever worsens then recommend getting chest x-ray.  Future standing chest x-ray order placed.  Rest and hydrate.  You have albuterol available for any wheezing.  Follow-up in 7 days or as needed.

## 2018-04-19 NOTE — Progress Notes (Signed)
Subjective:    Patient ID: Holly Bartlett, female    DOB: 06-Jan-1961, 58 y.o.   MRN: [id number]  HPI  Pt in for evaluation. Pt husband has type A flu on Monday.  Pt Tuesday went to UC. She was sick since past Wednesday. She was on road in [place]  when first got sick. She had cough, congestion, and bodyaches. When evaluated at UC she was not given tamiflu since she was sick for about 5 days  Pt still has cough and had fever. Her cough has been productive throughout the entire time.  Pt has been wheezing a little bit at end of cough. States at one point had purring type sound from chest.      Review of Systems  Constitutional: Positive for fever. Negative for chills and fatigue.  HENT: Negative for congestion.   Respiratory: Positive for cough and wheezing. Negative for chest tightness and shortness of breath.   Cardiovascular: Negative for chest pain and palpitations.  Gastrointestinal: Negative for abdominal pain.  Genitourinary: Negative for dysuria, flank pain and hematuria.  Musculoskeletal: Positive for myalgias. Negative for back pain.  Skin: Negative for rash.  Neurological: Positive for headaches. Negative for dizziness, seizures, syncope, weakness and light-headedness.  Hematological: Negative for adenopathy.  Psychiatric/Behavioral: Negative for behavioral problems, confusion and sleep disturbance. The patient is not nervous/anxious.     Past Medical History:  Diagnosis Date  . Medical history non-contributory   . Prolapse of female pelvic organs   . Slow heart rate    01-11-18 HR 50 ; aymptomatic   . Wears glasses      Social History   Socioeconomic History  . Marital status: Married    Spouse name: Not on file  . Number of children: Not on file  . Years of education: Not on file  . Highest education level: Not on file  Occupational History  . Not on file  Social Needs  . Financial resource strain: Not on file  . Food insecurity:    Worry:  Not on file    Inability: Not on file  . Transportation needs:    Medical: Not on file    Non-medical: Not on file  Tobacco Use  . Smoking status: Never Smoker  . Smokeless tobacco: Never Used  Substance and Sexual Activity  . Alcohol use: Yes    Comment: 1-2 beers a week.  . Drug use: No  . Sexual activity: Yes  Lifestyle  . Physical activity:    Days per week: Not on file    Minutes per session: Not on file  . Stress: Not on file  Relationships  . Social connections:    Talks on phone: Not on file    Gets together: Not on file    Attends religious service: Not on file    Active member of club or organization: Not on file    Attends meetings of clubs or organizations: Not on file    Relationship status: Not on file  . Intimate partner violence:    Fear of current or ex partner: Not on file    Emotionally abused: Not on file    Physically abused: Not on file    Forced sexual activity: Not on file  Other Topics Concern  . Not on file  Social History Narrative  . Not on file    Past Surgical History:  Procedure Laterality Date  . ANTERIOR AND POSTERIOR REPAIR N/A 01/15/2018   Procedure: ANTERIOR (CYSTOCELE) AND POSTERIOR  REPAIR (RECTOCELE),  sacrospinous ligament suspension;  Surgeon: Marylynn Pearson, MD;  Location: [facility];  Service: Gynecology;  Laterality: N/A;  . BUNIONECTOMY Right 03/05/2014   Procedure: [place] RIGHT FOOT;  Surgeon: Harriet Masson, DPM;  Location: [facility];  Service: Podiatry;  Laterality: Right;  . DENTAL SURGERY     wisdom teeth  . LAPAROSCOPIC VAGINAL HYSTERECTOMY WITH SALPINGO OOPHORECTOMY Bilateral 01/15/2018   Procedure: LAPAROSCOPIC ASSISTED VAGINAL HYSTERECTOMY WITH SALPINGO OOPHORECTOMY;  Surgeon: Marylynn Pearson, MD;  Location: [facility];  Service: Gynecology;  Laterality: Bilateral;  need bed  . TONSILLECTOMY      Family History  Problem Relation Age of Onset  .  Diabetes Mother   . Hypertension Mother   . Cancer Mother   . Diabetes Father   . Heart disease Father     No Known Allergies  Current Outpatient Medications on File Prior to Visit  Medication Sig Dispense Refill  . albuterol (PROVENTIL HFA;VENTOLIN HFA) 108 (90 Base) MCG/ACT inhaler Inhale 2 puffs into the lungs every 6 (six) hours as needed for up to 10 days. 1 Inhaler 0  . fluticasone (FLONASE) 50 MCG/ACT nasal spray Place 2 sprays into both nostrils daily. 16 g 0  . promethazine-dextromethorphan (PROMETHAZINE-DM) 6.25-15 MG/5ML syrup Take 5 mLs by mouth 4 (four) times daily as needed for up to 7 days. 140 mL 0   No current facility-administered medications on file prior to visit.     BP 126/76   Pulse (!) 56   Temp 98.5 F (36.9 C) (Oral)   Resp 16   Ht 5\' 2"  (1.575 m)   Wt 137 lb 9.6 oz (62.4 kg)   SpO2 97%   BMI 25.17 kg/m       Objective:   Physical Exam  General  Mental Status - Alert. General Appearance - Well groomed. Not in acute distress.  Skin Rashes- No Rashes.  HEENT Head- Normal. Ear Auditory Canal - Left- Normal. Right - Normal.Tympanic Membrane- Left- Normal. Right- Normal. Eye Sclera/Conjunctiva- Left- Normal. Right- Normal. Nose & Sinuses Nasal Mucosa- Left-  Boggy and Congested. Right-  Boggy and  Congested.Bilateral maxillary and frontal sinus pressure. Mouth & Throat Lips: Upper Lip- Normal: no dryness, cracking, pallor, cyanosis, or vesicular eruption. Lower Lip-Normal: no dryness, cracking, pallor, cyanosis or vesicular eruption. Buccal Mucosa- Bilateral- No Aphthous ulcers. Oropharynx- No Discharge or Erythema. Tonsils: Characteristics- Bilateral- No Erythema or Congestion. Size/Enlargement- Bilateral- No enlargement. Discharge- bilateral-None.  Neck Neck- Supple. No Masses.   Chest and Lung Exam Auscultation: Breath Sounds:-Clear even and unlabored.  Cardiovascular Auscultation:Rythm- Regular, rate and rhythm. Murmurs & Other  Heart Sounds:Ausculatation of the heart reveal- No Murmurs.  Lymphatic Head & Neck General Head & Neck Lymphatics: Bilateral: Description- No Localized lymphadenopathy.       Assessment & Plan:  You do appear to have had the flu last week and now presenting with bronchitis with possible sinus infection.  Will prescribe doxycycline antibiotic, Flonase for nasal congestion and Hycodan for cough.  Rx advisement given on meds.  If your chest congestion cough or fever worsens then recommend getting chest x-ray.  Future standing chest x-ray order placed.  Rest and hydrate.  You have albuterol available for any wheezing.  Follow-up in 7 days or as needed.

## 2018-06-26 MED FILL — AMOXICILLIN 500 MG CAPSULE: 500 | 7 days supply | Qty: 28 | Fill #0

## 2018-09-29 DIAGNOSIS — Z20828 Contact with and (suspected) exposure to other viral communicable diseases: Secondary | ICD-10-CM | POA: Diagnosis not present

## 2018-10-24 ENCOUNTER — Encounter: Payer: Self-pay | Admitting: Medical

## 2018-12-11 ENCOUNTER — Encounter: Payer: Self-pay | Admitting: Medical

## 2018-12-11 ENCOUNTER — Telehealth: Payer: Self-pay | Admitting: Medical

## 2018-12-11 DIAGNOSIS — Z20822 Contact with and (suspected) exposure to covid-19: Secondary | ICD-10-CM

## 2018-12-11 DIAGNOSIS — Z20828 Contact with and (suspected) exposure to other viral communicable diseases: Secondary | ICD-10-CM

## 2018-12-11 NOTE — Telephone Encounter (Signed)
Future screening covid order placed prior to upcoming travel. See my chart message.

## 2018-12-17 ENCOUNTER — Other Ambulatory Visit: Payer: Self-pay

## 2018-12-17 DIAGNOSIS — Z20822 Contact with and (suspected) exposure to covid-19: Secondary | ICD-10-CM

## 2018-12-19 LAB — NOVEL CORONAVIRUS, NAA: SARS-CoV-2, NAA: NOT DETECTED

## 2018-12-24 ENCOUNTER — Other Ambulatory Visit: Payer: Self-pay

## 2018-12-24 DIAGNOSIS — Z20822 Contact with and (suspected) exposure to covid-19: Secondary | ICD-10-CM

## 2018-12-25 LAB — NOVEL CORONAVIRUS, NAA: SARS-CoV-2, NAA: NOT DETECTED

## 2019-01-28 DIAGNOSIS — D1801 Hemangioma of skin and subcutaneous tissue: Secondary | ICD-10-CM | POA: Diagnosis not present

## 2019-01-28 DIAGNOSIS — L820 Inflamed seborrheic keratosis: Secondary | ICD-10-CM | POA: Diagnosis not present

## 2019-01-28 DIAGNOSIS — D225 Melanocytic nevi of trunk: Secondary | ICD-10-CM | POA: Diagnosis not present

## 2019-01-28 DIAGNOSIS — L814 Other melanin hyperpigmentation: Secondary | ICD-10-CM | POA: Diagnosis not present

## 2019-01-28 DIAGNOSIS — X32XXXS Exposure to sunlight, sequela: Secondary | ICD-10-CM | POA: Diagnosis not present

## 2019-01-28 DIAGNOSIS — L720 Epidermal cyst: Secondary | ICD-10-CM | POA: Diagnosis not present

## 2019-01-30 ENCOUNTER — Encounter: Payer: Self-pay | Admitting: Medical

## 2019-01-30 ENCOUNTER — Telehealth: Payer: Self-pay | Admitting: Medical

## 2019-01-30 DIAGNOSIS — Z1159 Encounter for screening for other viral diseases: Secondary | ICD-10-CM

## 2019-01-30 DIAGNOSIS — Z0000 Encounter for general adult medical examination without abnormal findings: Secondary | ICD-10-CM

## 2019-01-30 NOTE — Telephone Encounter (Signed)
Future wellness exam labs ordered at pt request. To be done before day of actual exam.

## 2019-02-05 ENCOUNTER — Other Ambulatory Visit (INDEPENDENT_AMBULATORY_CARE_PROVIDER_SITE_OTHER): Payer: 59

## 2019-02-05 ENCOUNTER — Other Ambulatory Visit: Payer: Self-pay

## 2019-02-05 DIAGNOSIS — Z0000 Encounter for general adult medical examination without abnormal findings: Secondary | ICD-10-CM

## 2019-02-05 DIAGNOSIS — Z1159 Encounter for screening for other viral diseases: Secondary | ICD-10-CM

## 2019-02-05 LAB — CBC WITH DIFFERENTIAL/PLATELET
Basophils Absolute: 0.1 10*3/uL (ref 0.0–0.1)
Basophils Relative: 1.8 % (ref 0.0–3.0)
Eosinophils Absolute: 0.1 10*3/uL (ref 0.0–0.7)
Eosinophils Relative: 2.7 % (ref 0.0–5.0)
HCT: 32 % — ABNORMAL LOW (ref 36.0–46.0)
Hemoglobin: 10.6 g/dL — ABNORMAL LOW (ref 12.0–15.0)
Lymphocytes Relative: 25.6 % (ref 12.0–46.0)
Lymphs Abs: 1.1 10*3/uL (ref 0.7–4.0)
MCHC: 33.1 g/dL (ref 30.0–36.0)
MCV: 84.3 fl (ref 78.0–100.0)
Monocytes Absolute: 0.4 10*3/uL (ref 0.1–1.0)
Monocytes Relative: 10.2 % (ref 3.0–12.0)
Neutro Abs: 2.5 10*3/uL (ref 1.4–7.7)
Neutrophils Relative %: 59.7 % (ref 43.0–77.0)
Platelets: 369 10*3/uL (ref 150.0–400.0)
RBC: 3.8 Mil/uL — ABNORMAL LOW (ref 3.87–5.11)
RDW: 13.2 % (ref 11.5–15.5)
WBC: 4.3 10*3/uL (ref 4.0–10.5)

## 2019-02-05 LAB — COMPREHENSIVE METABOLIC PANEL
ALT: 11 U/L (ref 0–35)
AST: 18 U/L (ref 0–37)
Albumin: 4.2 g/dL (ref 3.5–5.2)
Alkaline Phosphatase: 76 U/L (ref 39–117)
BUN: 18 mg/dL (ref 6–23)
CO2: 27 mEq/L (ref 19–32)
Calcium: 9.6 mg/dL (ref 8.4–10.5)
Chloride: 106 mEq/L (ref 96–112)
Creatinine, Ser: 0.78 mg/dL (ref 0.40–1.20)
GFR: 75.64 mL/min (ref >60.00)
Glucose, Bld: 87 mg/dL (ref 70–99)
Potassium: 4 mEq/L (ref 3.5–5.1)
Sodium: 140 mEq/L (ref 135–145)
Total Bilirubin: 0.4 mg/dL (ref 0.2–1.2)
Total Protein: 6.6 g/dL (ref 6.0–8.3)

## 2019-02-05 LAB — LIPID PANEL
Cholesterol: 239 mg/dL — ABNORMAL HIGH (ref 0–200)
HDL: 74.6 mg/dL (ref >39.00)
LDL Cholesterol: 150 mg/dL — ABNORMAL HIGH (ref 0–99)
NonHDL: 164.8
Total CHOL/HDL Ratio: 3
Triglycerides: 76 mg/dL (ref 0.0–149.0)
VLDL: 15.2 mg/dL (ref 0.0–40.0)

## 2019-02-06 ENCOUNTER — Other Ambulatory Visit (INDEPENDENT_AMBULATORY_CARE_PROVIDER_SITE_OTHER): Payer: 59

## 2019-02-06 ENCOUNTER — Telehealth: Payer: Self-pay | Admitting: Medical

## 2019-02-06 DIAGNOSIS — D649 Anemia, unspecified: Secondary | ICD-10-CM

## 2019-02-06 LAB — IBC PANEL
Iron: 33 ug/dL — ABNORMAL LOW (ref 42–145)
Saturation Ratios: 6 % — ABNORMAL LOW (ref 20.0–50.0)
Transferrin: 395 mg/dL — ABNORMAL HIGH (ref 212.0–360.0)

## 2019-02-06 LAB — HEPATITIS C ANTIBODY
Hepatitis C Ab: NONREACTIVE
SIGNAL TO CUT-OFF: 0.01 (ref <1.00)

## 2019-02-06 MED ORDER — FERROUS SULFATE 324 (65 FE) MG PO TBEC: DELAYED_RELEASE_TABLET | ORAL | 2 refills | Status: AC

## 2019-02-06 NOTE — Telephone Encounter (Signed)
Rx iron sent to pt pharmacy. °

## 2019-02-11 ENCOUNTER — Encounter: Payer: 59 | Admitting: Medical

## 2019-02-11 DIAGNOSIS — Z01419 Encounter for gynecological examination (general) (routine) without abnormal findings: Secondary | ICD-10-CM | POA: Diagnosis not present

## 2019-02-11 DIAGNOSIS — Z6825 Body mass index (BMI) 25.0-25.9, adult: Secondary | ICD-10-CM | POA: Diagnosis not present

## 2019-02-11 DIAGNOSIS — Z1231 Encounter for screening mammogram for malignant neoplasm of breast: Secondary | ICD-10-CM | POA: Diagnosis not present

## 2019-02-11 DIAGNOSIS — Z13820 Encounter for screening for osteoporosis: Secondary | ICD-10-CM | POA: Diagnosis not present

## 2019-02-12 ENCOUNTER — Encounter: Payer: Self-pay | Admitting: Medical

## 2019-02-12 ENCOUNTER — Ambulatory Visit (INDEPENDENT_AMBULATORY_CARE_PROVIDER_SITE_OTHER): Payer: 59 | Admitting: Medical

## 2019-02-12 ENCOUNTER — Other Ambulatory Visit: Payer: Self-pay

## 2019-02-12 VITALS — BP 109/56 | HR 59 | Temp 97.3°F | Resp 12 | Ht 62.0 in | Wt 137.2 lb

## 2019-02-12 DIAGNOSIS — Z0000 Encounter for general adult medical examination without abnormal findings: Secondary | ICD-10-CM

## 2019-02-12 DIAGNOSIS — Z0001 Encounter for general adult medical examination with abnormal findings: Secondary | ICD-10-CM

## 2019-02-12 DIAGNOSIS — R319 Hematuria, unspecified: Secondary | ICD-10-CM

## 2019-02-12 DIAGNOSIS — D649 Anemia, unspecified: Secondary | ICD-10-CM

## 2019-02-12 DIAGNOSIS — E785 Hyperlipidemia, unspecified: Secondary | ICD-10-CM

## 2019-02-12 LAB — URINALYSIS, ROUTINE W REFLEX MICROSCOPIC
Bilirubin Urine: NEGATIVE
Hgb urine dipstick: NEGATIVE
Ketones, ur: NEGATIVE
Nitrite: NEGATIVE
Specific Gravity, Urine: 1.025 (ref 1.000–1.030)
Total Protein, Urine: NEGATIVE
Urine Glucose: NEGATIVE
Urobilinogen, UA: 0.2 (ref 0.0–1.0)
pH: 5.5 (ref 5.0–8.0)

## 2019-02-12 NOTE — Patient Instructions (Addendum)
For you wellness exam discussed/reviewed labs.  Flu vaccine done at work. She plans to get covid vaccine.  Recommend exercise and healthy diet.  We will let you know lab results as they come in.  Follow up date appointment will be determined after lab review.   Low cholesterol diet, exercise and try fish oil or Krill.  For anemia continue iron and check stool for blood(turn in cards). Repeat cbc in one month.  Will repeat urine for blood today.      Preventive Care 28-7 Years Old, Female Preventive care refers to visits with your health care provider and lifestyle choices that can promote health and wellness. This includes:  A yearly physical exam. This may also be called an annual well check.  Regular dental visits and eye exams.  Immunizations.  Screening for certain conditions.  Healthy lifestyle choices, such as eating a healthy diet, getting regular exercise, not using drugs or products that contain nicotine and tobacco, and limiting alcohol use. What can I expect for my preventive care visit? Physical exam Your health care provider will check your:  Height and weight. This may be used to calculate body mass index (BMI), which tells if you are at a healthy weight.  Heart rate and blood pressure.  Skin for abnormal spots. Counseling Your health care provider may ask you questions about your:  Alcohol, tobacco, and drug use.  Emotional well-being.  Home and relationship well-being.  Sexual activity.  Eating habits.  Work and work Statistician.  Method of birth control.  Menstrual cycle.  Pregnancy history. What immunizations do I need?  Influenza (flu) vaccine  This is recommended every year. Tetanus, diphtheria, and pertussis (Tdap) vaccine  You may need a Td booster every 10 years. Varicella (chickenpox) vaccine  You may need this if you have not been vaccinated. Zoster (shingles) vaccine  You may need this after age 55. Measles, mumps,  and rubella (MMR) vaccine  You may need at least one dose of MMR if you were born in 1957 or later. You may also need a second dose. Pneumococcal conjugate (PCV13) vaccine  You may need this if you have certain conditions and were not previously vaccinated. Pneumococcal polysaccharide (PPSV23) vaccine  You may need one or two doses if you smoke cigarettes or if you have certain conditions. Meningococcal conjugate (MenACWY) vaccine  You may need this if you have certain conditions. Hepatitis A vaccine  You may need this if you have certain conditions or if you travel or work in places where you may be exposed to hepatitis A. Hepatitis B vaccine  You may need this if you have certain conditions or if you travel or work in places where you may be exposed to hepatitis B. Haemophilus influenzae type b (Hib) vaccine  You may need this if you have certain conditions. Human papillomavirus (HPV) vaccine  If recommended by your health care provider, you may need three doses over 6 months. You may receive vaccines as individual doses or as more than one vaccine together in one shot (combination vaccines). Talk with your health care provider about the risks and benefits of combination vaccines. What tests do I need? Blood tests  Lipid and cholesterol levels. These may be checked every 5 years, or more frequently if you are over 84 years old.  Hepatitis C test.  Hepatitis B test. Screening  Lung cancer screening. You may have this screening every year starting at age 45 if you have a 30-pack-year history of smoking  and currently smoke or have quit within the past 15 years.  Colorectal cancer screening. All adults should have this screening starting at age 72 and continuing until age 96. Your health care provider may recommend screening at age 60 if you are at increased risk. You will have tests every 1-10 years, depending on your results and the type of screening test.  Diabetes screening.  This is done by checking your blood sugar (glucose) after you have not eaten for a while (fasting). You may have this done every 1-3 years.  Mammogram. This may be done every 1-2 years. Talk with your health care provider about when you should start having regular mammograms. This may depend on whether you have a family history of breast cancer.  BRCA-related cancer screening. This may be done if you have a family history of breast, ovarian, tubal, or peritoneal cancers.  Pelvic exam and Pap test. This may be done every 3 years starting at age 22. Starting at age 77, this may be done every 5 years if you have a Pap test in combination with an HPV test. Other tests  Sexually transmitted disease (STD) testing.  Bone density scan. This is done to screen for osteoporosis. You may have this scan if you are at high risk for osteoporosis. Follow these instructions at home: Eating and drinking  Eat a diet that includes fresh fruits and vegetables, whole grains, lean protein, and low-fat dairy.  Take vitamin and mineral supplements as recommended by your health care provider.  Do not drink alcohol if: ? Your health care provider tells you not to drink. ? You are pregnant, may be pregnant, or are planning to become pregnant.  If you drink alcohol: ? Limit how much you have to 0-1 drink a day. ? Be aware of how much alcohol is in your drink. In the [place] one drink equals one 12 oz bottle of beer (355 mL), one 5 oz glass of wine (148 mL), or one 1 oz glass of hard liquor (44 mL). Lifestyle  Take daily care of your teeth and gums.  Stay active. Exercise for at least 30 minutes on 5 or more days each week.  Do not use any products that contain nicotine or tobacco, such as cigarettes, e-cigarettes, and chewing tobacco. If you need help quitting, ask your health care provider.  If you are sexually active, practice safe sex. Use a condom or other form of birth control (contraception) in order to  prevent pregnancy and STIs (sexually transmitted infections).  If told by your health care provider, take low-dose aspirin daily starting at age 14. What's next?  Visit your health care provider once a year for a well check visit.  Ask your health care provider how often you should have your eyes and teeth checked.  Stay up to date on all vaccines. This information is not intended to replace advice given to you by your health care provider. Make sure you discuss any questions you have with your health care provider. Document Released: 03/13/2015 Document Revised: 10/26/2017 Document Reviewed: 10/26/2017 Elsevier Patient Education  2020 [employer].

## 2019-02-12 NOTE — Progress Notes (Signed)
Subjective:    Patient ID: Holly Bartlett, female    DOB: March 03, 1960, 58 y.o.   MRN: [id number]  HPI  Pt in for wellness exam.  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 She runs, swims and bikes. About 5 times. Short triathalons. Eats healthy. Married. 3-4 diet cokes a day.  Pt had some anemia on labs. She had repeat cbc at gyn and her hb was 11.5. Pt former cbc showed hb 10.6 about week before. No black or blood stools.Pt had hysterectomy. No special diet in the past.   No hx of smoking.  Pt had colonoscopy in 2014. She states was normal.    Pt has high cholesterol on labs. Pt mom and dad both CAD. Both parents had bypass. Brother is on medication for cholesterol. Pt does not want to take any rx meds.       Review of Systems  Constitutional: Negative for chills, fatigue and fever.  HENT: Negative for congestion and drooling.   Respiratory: Negative for cough, chest tightness, shortness of breath and wheezing.   Cardiovascular: Negative for chest pain and palpitations.  Gastrointestinal: Negative for abdominal pain.  Genitourinary: Negative for decreased urine volume, dysuria and pelvic pain.  Musculoskeletal: Negative for back pain.  Skin: Negative for rash.  Neurological: Negative for dizziness, seizures and light-headedness.  Psychiatric/Behavioral: Negative for behavioral problems, decreased concentration and suicidal ideas. The patient is not nervous/anxious.    Past Medical History:  Diagnosis Date  . Medical history non-contributory   . Prolapse of female pelvic organs   . Slow heart rate    01-11-18 HR 50 ; aymptomatic   . Wears glasses      Social History   Socioeconomic History  . Marital status: Married    Spouse name: Not on file  . Number of children: Not on file  . Years of education: Not on file  . Highest education level: Not on file  Occupational History  . Not on file  Tobacco Use  . Smoking status: Never Smoker  . Smokeless  tobacco: Never Used  Substance and Sexual Activity  . Alcohol use: Yes    Comment: 1-2 beers a week.  . Drug use: No  . Sexual activity: Yes  Other Topics Concern  . Not on file  Social History Narrative  . Not on file   Social Determinants of Health   Financial Resource Strain:   . Difficulty of Paying Living Expenses: Not on file  Food Insecurity:   . Worried About Charity fundraiser in the Last Year: Not on file  . Ran Out of Food in the Last Year: Not on file  Transportation Needs:   . Lack of Transportation (Medical): Not on file  . Lack of Transportation (Non-Medical): Not on file  Physical Activity:   . Days of Exercise per Week: Not on file  . Minutes of Exercise per Session: Not on file  Stress:   . Feeling of Stress : Not on file  Social Connections:   . Frequency of Communication with Friends and Family: Not on file  . Frequency of Social Gatherings with Friends and Family: Not on file  . Attends Religious Services: Not on file  . Active Member of Clubs or Organizations: Not on file  . Attends Archivist Meetings: Not on file  . Marital Status: Not on file  Intimate Partner Violence:   . Fear of Current or Ex-Partner: Not on file  .  Emotionally Abused: Not on file  . Physically Abused: Not on file  . Sexually Abused: Not on file    Past Surgical History:  Procedure Laterality Date  . ANTERIOR AND POSTERIOR REPAIR N/A 01/15/2018   Procedure: ANTERIOR (CYSTOCELE) AND POSTERIOR REPAIR (RECTOCELE),  sacrospinous ligament suspension;  Surgeon: Marylynn Pearson, MD;  Location: [facility];  Service: Gynecology;  Laterality: N/A;  . BUNIONECTOMY Right 03/05/2014   Procedure: Alt[place]e [place]inner RIGHT FOOT;  Surgeon: Harriet Masson, DPM;  Location: [place];  Service: Podiatry;  Laterality: Right;  . DENTAL SURGERY     wisdom teeth  . LAPAROSCOPIC VAGINAL HYSTERECTOMY WITH SALPINGO OOPHORECTOMY Bilateral 01/15/2018    Procedure: LAPAROSCOPIC ASSISTED VAGINAL HYSTERECTOMY WITH SALPINGO OOPHORECTOMY;  Surgeon: Marylynn Pearson, MD;  Location: [place];  Service: Gynecology;  Laterality: Bilateral;  need bed  . TONSILLECTOMY      Family History  Problem Relation Age of Onset  . Diabetes Mother   . Hypertension Mother   . Cancer Mother   . Diabetes Father   . Heart disease Father     No Known Allergies  Current Outpatient Medications on File Prior to Visit  Medication Sig Dispense Refill  . ferrous sulfate 324 (65 Fe) MG TBEC 1 tab po bid 30 tablet 2   No current facility-administered medications on file prior to visit.    BP (!) 109/56 (BP Location: Left Arm, Cuff Size: Normal)   Pulse (!) 59   Temp (!) 97.3 F (36.3 C) (Temporal)   Resp 12   Ht 5\' 2"  (1.575 m)   Wt 137 lb 3.2 oz (62.2 kg)   SpO2 98%   BMI 25.09 kg/m       Objective:   Physical Exam  General Mental Status- Alert. General Appearance- Not in acute distress.   Skin General: Color- Normal Color. Moisture- Normal Moisture.  Neck Carotid Arteries- Normal color. Moisture- Normal Moisture. No carotid bruits. No JVD.  Chest and Lung Exam Auscultation: Breath Sounds:-Normal.  Cardiovascular Auscultation:Rythm- Regular. Murmurs & Other Heart Sounds:Auscultation of the heart reveals- No Murmurs.  Abdomen Inspection:-Inspeection Normal. Palpation/Percussion:Note:No mass. Palpation and Percussion of the abdomen reveal- Non Tender, Non Distended + BS, no rebound or guarding.    Neurologic Cranial Nerve exam:- CN III-XII intact(No nystagmus), symmetric smile. Strength:- 5/5 equal and symmetric strength both upper and lower extremities.      Assessment & Plan:  For you wellness exam discussed/reviewed labs.  Flu vaccine done at work. She plans to get covid vaccine.  Recommend exercise and healthy diet.  We will let you know lab results as they come in.  Follow up date appointment will be  determined after lab review.   Low cholesterol diet, exercise and try fish oil or Krill.  For anemia continue iron and check stool for blood(turn in cards). Repeat cbc in one month.  Will repeat urine for blood today.  [id number] charge as counseled and addressed on anemia, cholesterol and trace blood in urine.   Mackie Pai, PA-C

## 2019-02-12 NOTE — Addendum Note (Signed)
Addended by: Kem Boroughs D on: 02/12/2019 10:00 AM   Modules accepted: Orders

## 2019-02-14 ENCOUNTER — Telehealth: Payer: Self-pay | Admitting: *Deleted

## 2019-02-14 NOTE — Telephone Encounter (Signed)
Sorry late on reviewing results and urine culture should be added within 24 hours.  Would you like for me to have her come back to recollect to send out for culture?

## 2019-02-14 NOTE — Telephone Encounter (Signed)
-----   Message from Mackie Pai, PA-C sent at 02/12/2019  1:48 PM EST ----- You urine did not show any blood but did show some small leukocytes present. Not real suspicious unless you have uti type symptoms. Asking staff to see if we can have urine culture sent out.

## 2019-02-14 NOTE — Telephone Encounter (Signed)
If you could give pt option. Urine was not real suspicious. If she wants to do culture can get her scheduled for culture. But if not symptomatic may not be needed.

## 2019-02-18 NOTE — Telephone Encounter (Signed)
Patient stated that she is not having any symptoms and that she is doing ok.

## 2019-03-14 DIAGNOSIS — M8580 Other specified disorders of bone density and structure, unspecified site: Secondary | ICD-10-CM | POA: Diagnosis not present

## 2019-04-15 ENCOUNTER — Encounter: Payer: Self-pay | Admitting: Medical

## 2019-04-16 ENCOUNTER — Encounter: Payer: Self-pay | Admitting: Medical

## 2019-06-11 IMAGING — MR MR BREAST BIOPSY
6 of 8 series · 34 of 48 positions shown · IV contrast (6ml gadavist)
Comparison: Previous exams.

Addendum:
CLINICAL DATA: Patient presents for MRI guided biopsy a linear area
of enhancement in the upper inner left breast.

EXAM:
MRI GUIDED CORE NEEDLE BIOPSY OF THE LEFT BREAST
TECHNIQUE: Multiplanar, multisequence MR imaging of the left breast was
performed both before and after administration of intravenous
contrast.
CONTRAST:  6 mL of Gadavist intravenous contrast

[Series 2: fiducial unilateral · sagittal · 2.0mm · 1.33mm/px · 1 of 52 slices shown]
[im 1/52]
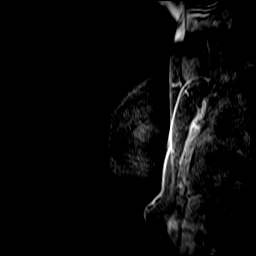

[Series 3: dynamic pre · axial · non-contrast · 1.3mm · 0.73mm/px · z∈[-123,+83]mm · 6 of 160 slices shown]
[im 1/160]
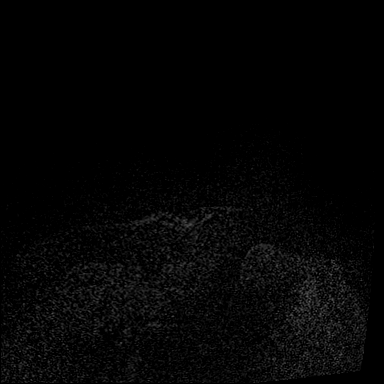
[im 32/160]
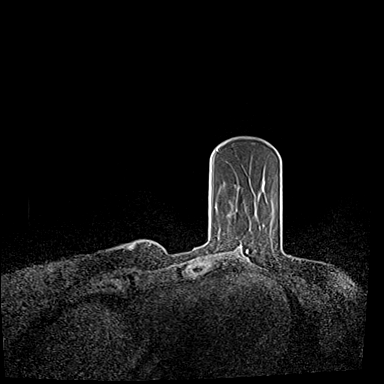
[im 64/160]
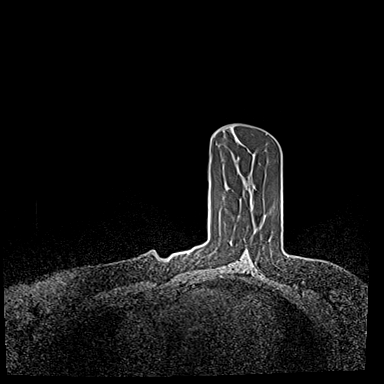
[im 96/160]
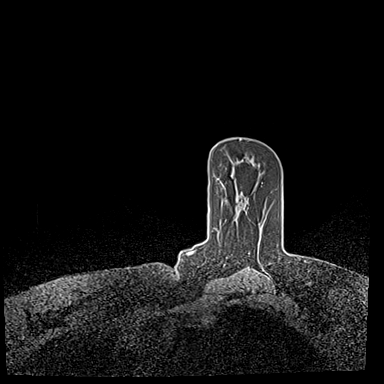
[im 128/160]
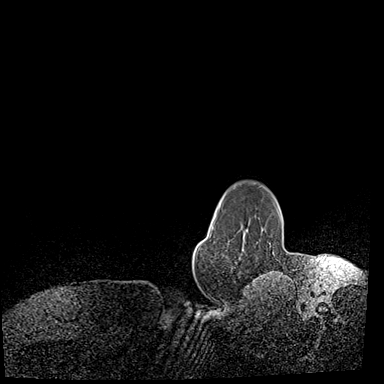
[im 160/160]
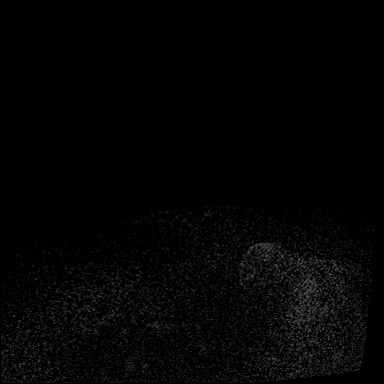

[Series 4: dynamic post 20 · axial · 1.3mm · 0.73mm/px · z∈[-123,+83]mm · 6 of 160 slices shown (1 of 2)]
[im 1/160]
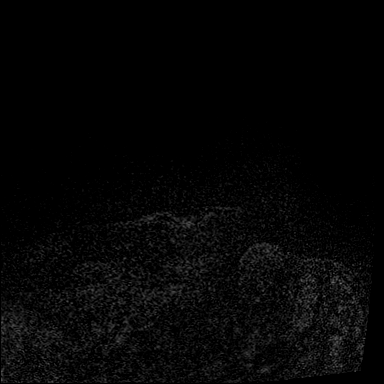
[im 32/160]
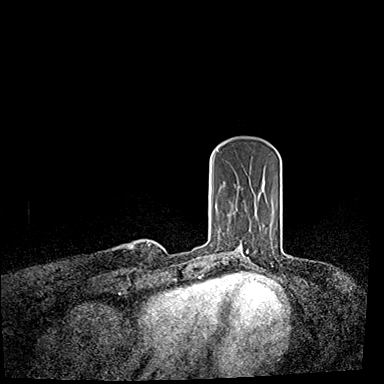
[im 64/160]
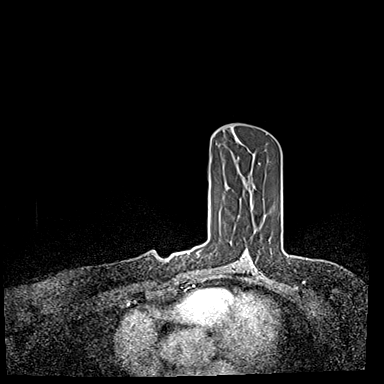
[im 96/160]
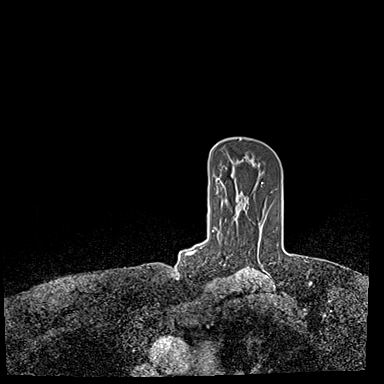
[im 128/160]
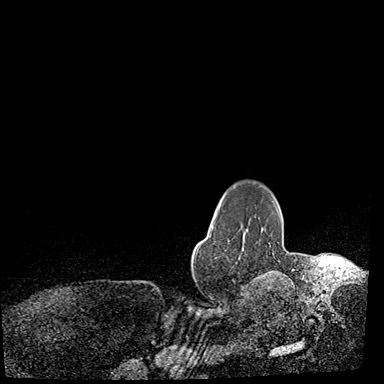
[im 160/160]
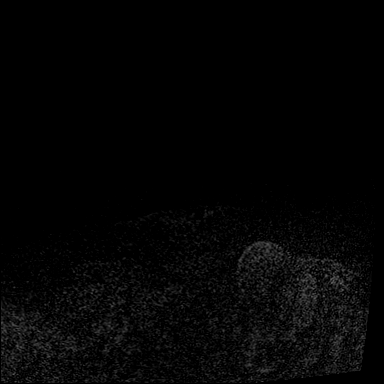

[Series 5: dynamic post 20 · axial · 1.3mm · 0.73mm/px · z∈[-123,+83]mm · 7 of 160 slices shown (2 of 2)]
[im 1/160]
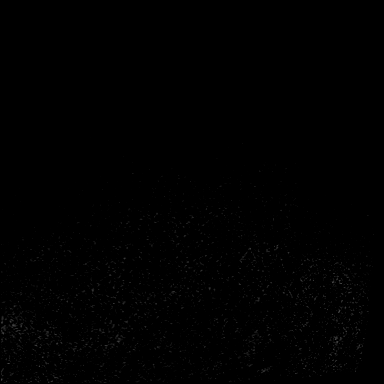
[im 27/160]
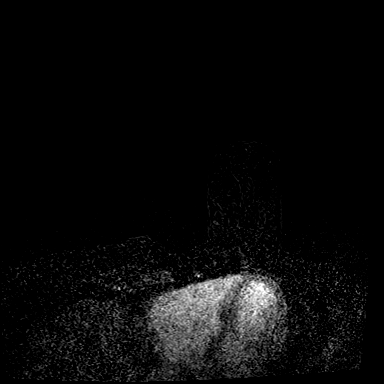
[im 54/160]
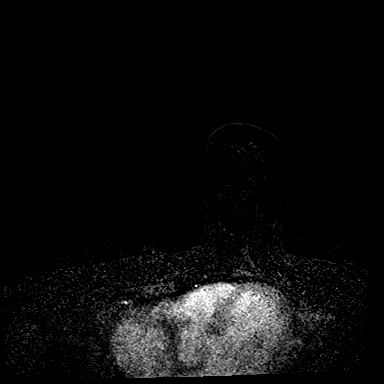
[im 80/160]
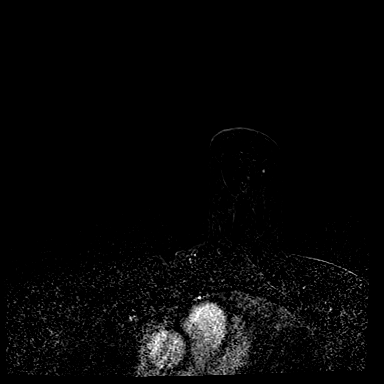
[im 107/160]
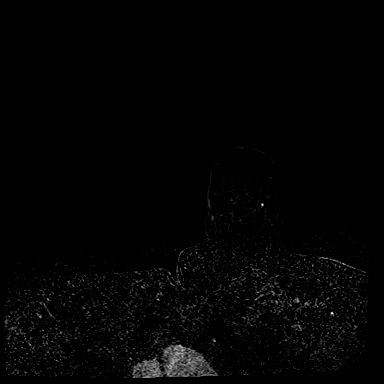
[im 133/160]
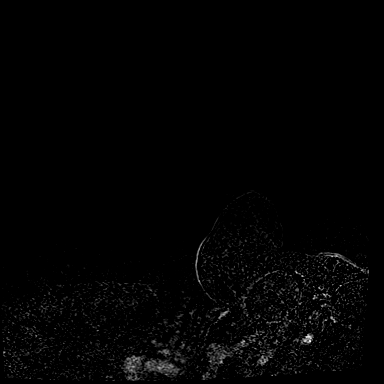
[im 160/160]
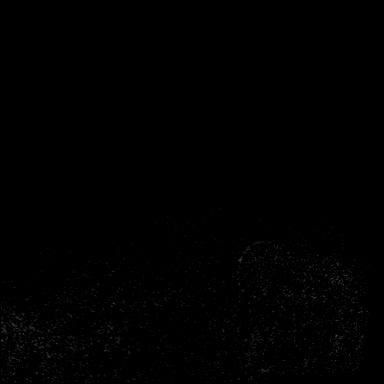

[Series 6: dynamic post 3 · axial · 1.3mm · 0.73mm/px · z∈[-123,+83]mm · 7 of 160 slices shown (1 of 2)]
[im 1/160]
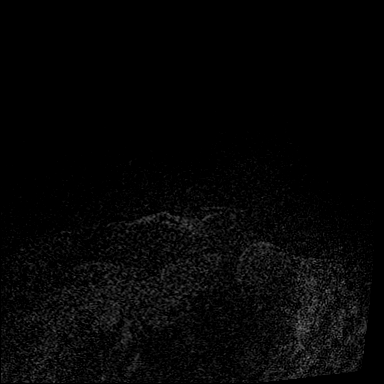
[im 27/160]
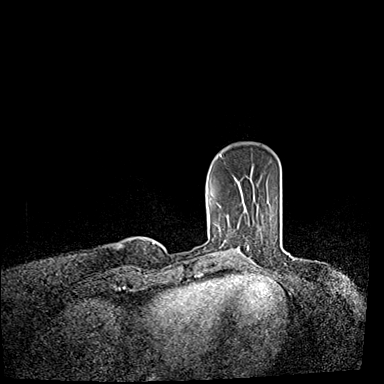
[im 54/160]
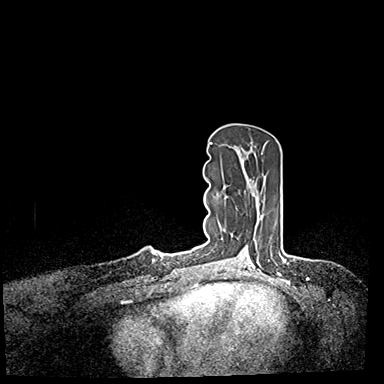
[im 80/160]
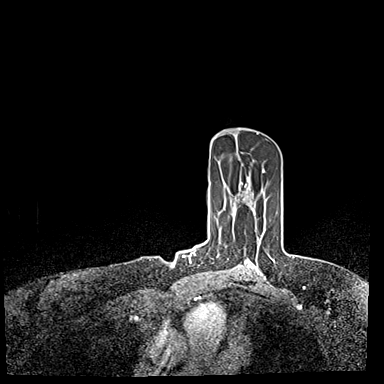
[im 107/160]
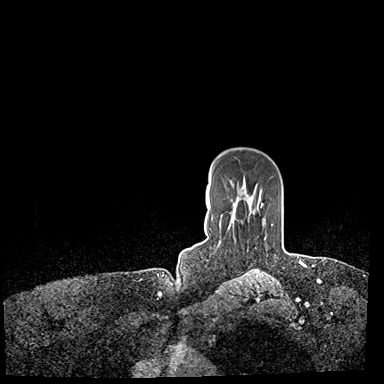
[im 133/160]
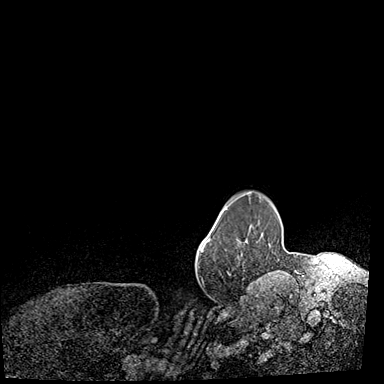
[im 160/160]
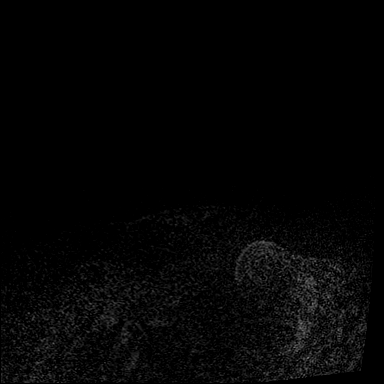

[Series 7: dynamic post 3 · axial · 1.3mm · 0.73mm/px · z∈[-123,+83]mm · 7 of 160 slices shown (2 of 2)]
[im 1/160]
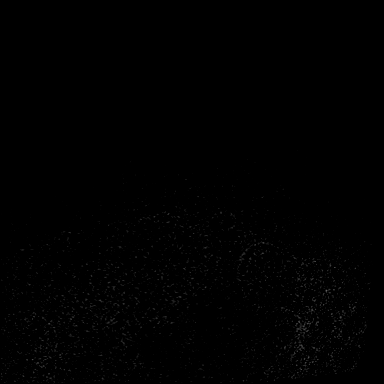
[im 27/160]
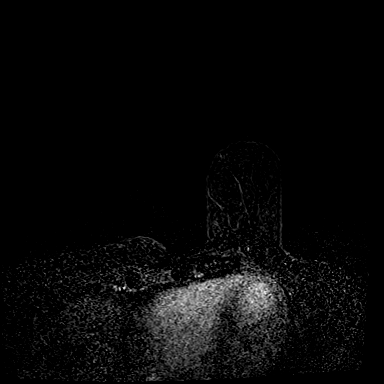
[im 54/160]
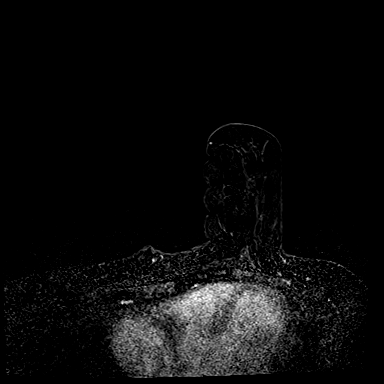
[im 80/160]
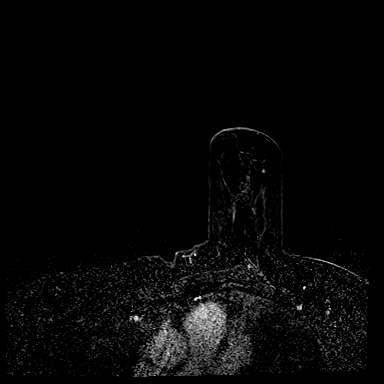
[im 107/160]
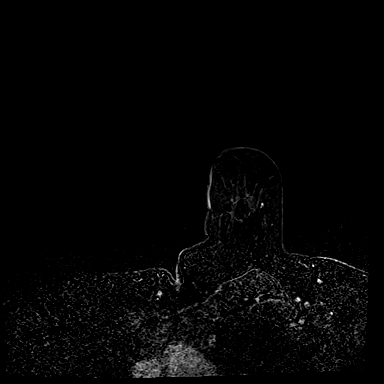
[im 133/160]
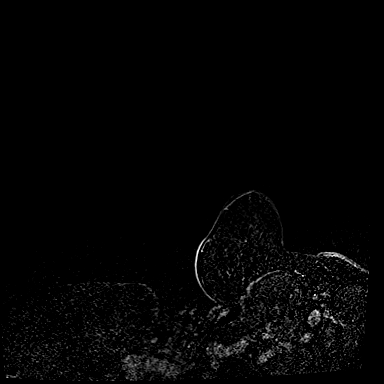
[im 160/160]
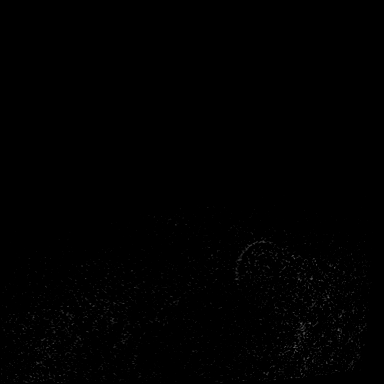

[34 of 48 positions shown; findings below may reference images not displayed]

FINDINGS: I met with the patient, and we discussed the procedure of MRI guided
biopsy, including risks, benefits, and alternatives. Specifically,
we discussed the risks of infection, bleeding, tissue injury, clip
migration, and inadequate sampling. Informed, written consent was
given. The usual time out protocol was performed immediately prior
to the procedure.

Using sterile technique, 1% Lidocaine, MRI guidance, and a 9 gauge
vacuum assisted device, biopsy was performed of the linear area of
enhancement in the upper inner left breast using a medial approach.
At the conclusion of the procedure, a barbell shaped tissue marker
clip was deployed into the biopsy cavity. Follow-up 2-view mammogram
was performed and dictated separately.
IMPRESSION: MRI guided biopsy of the left breast.  No apparent complications.

ADDENDUM:
Pathology revealed HEMANGIOMA of the Left breast, upper inner
quadrant. This was found to be concordant by Dr. Drea Ji.

Pathology results were discussed with the patient by telephone. The
patient reported doing well after the biopsy with tenderness at the
site. Post biopsy instructions and care were reviewed and questions
were answered. The patient was encouraged to call The [REDACTED]

The patient was instructed to return for annual high risk screening
mammography and MRI.

Pathology results reported by Imara Audor, RN on 02/09/2018.

*** End of Addendum ***

## 2019-06-17 DIAGNOSIS — E559 Vitamin D deficiency, unspecified: Secondary | ICD-10-CM | POA: Diagnosis not present
